# Patient Record
Sex: Female | Born: 1995 | Race: Black or African American | Hispanic: No | Marital: Single | Smoking: Former smoker
Health system: Southern US, Community
[De-identification: ages and names within clinical notes are randomized; demographics above are authoritative.]

## PROBLEM LIST (undated history)

## (undated) DIAGNOSIS — J45909 Unspecified asthma, uncomplicated: Secondary | ICD-10-CM

## (undated) DIAGNOSIS — L309 Dermatitis, unspecified: Secondary | ICD-10-CM

## (undated) DIAGNOSIS — Z91018 Allergy to other foods: Secondary | ICD-10-CM

## (undated) DIAGNOSIS — J309 Allergic rhinitis, unspecified: Secondary | ICD-10-CM

## (undated) HISTORY — DX: Unspecified asthma, uncomplicated: J45.909

## (undated) HISTORY — DX: Dermatitis, unspecified: L30.9

## (undated) HISTORY — DX: Allergic rhinitis, unspecified: J30.9

## (undated) HISTORY — PX: TYMPANOSTOMY TUBE PLACEMENT: SHX32

## (undated) HISTORY — DX: Allergy to other foods: Z91.018

---
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
## 2017-11-17 ENCOUNTER — Ambulatory Visit (INDEPENDENT_AMBULATORY_CARE_PROVIDER_SITE_OTHER): Payer: Medicaid Other | Admitting: Allergy and Immunology

## 2017-11-17 ENCOUNTER — Encounter: Payer: Self-pay | Admitting: Allergy and Immunology

## 2017-11-17 VITALS — BP 98/60 | Ht 68.4 in | Wt 160.6 lb

## 2017-11-17 DIAGNOSIS — T7800XD Anaphylactic reaction due to unspecified food, subsequent encounter: Secondary | ICD-10-CM

## 2017-11-17 DIAGNOSIS — L2089 Other atopic dermatitis: Secondary | ICD-10-CM

## 2017-11-17 DIAGNOSIS — J3089 Other allergic rhinitis: Secondary | ICD-10-CM

## 2017-11-17 DIAGNOSIS — J4520 Mild intermittent asthma, uncomplicated: Secondary | ICD-10-CM | POA: Diagnosis not present

## 2017-11-17 MED ORDER — ALBUTEROL SULFATE (2.5 MG/3ML) 0.083% IN NEBU: 2.5000 mg | INHALATION_SOLUTION | RESPIRATORY_TRACT | 1 refills | Status: AC | PRN

## 2017-11-17 MED ORDER — FLUTICASONE PROPIONATE 50 MCG/ACT NA SUSP: NASAL | 5 refills | Status: AC

## 2017-11-17 MED ORDER — LORATADINE 10 MG PO TABS: 10.0000 mg | ORAL_TABLET | Freq: Every day | ORAL | 5 refills | Status: AC | PRN

## 2017-11-17 MED ORDER — TRIAMCINOLONE ACETONIDE 0.1 % EX CREA: TOPICAL_CREAM | CUTANEOUS | 5 refills | Status: DC

## 2017-11-17 MED ORDER — ALBUTEROL SULFATE HFA 108 (90 BASE) MCG/ACT IN AERS: INHALATION_SPRAY | RESPIRATORY_TRACT | 1 refills | Status: DC

## 2017-11-17 MED ORDER — OLOPATADINE HCL 0.7 % OP SOLN: 1.0000 [drp] | OPHTHALMIC | 5 refills | Status: AC

## 2017-11-17 MED ORDER — CRISABOROLE 2 % EX OINT: 1.0000 "application " | TOPICAL_OINTMENT | Freq: Two times a day (BID) | CUTANEOUS | 5 refills | Status: DC | PRN

## 2017-11-17 MED ORDER — IPRATROPIUM BROMIDE 0.06 % NA SOLN: NASAL | 5 refills | Status: AC

## 2017-11-17 MED ORDER — EPINEPHRINE 0.3 MG/0.3ML IJ SOAJ: INTRAMUSCULAR | 1 refills | Status: DC

## 2017-11-17 NOTE — Assessment & Plan Note (Signed)
Currently well controlled.  Continue albuterol HFA, 1 to 2 inhalations every 6 hours if needed.  Subjective and objective measures of pulmonary function will be followed and the treatment plan will be adjusted accordingly.

## 2017-11-17 NOTE — Progress Notes (Signed)
Follow-up Note  RE: Madison NottinghamGenetria French MRN: [id number] DOB: Aug 06, 1995 Date of Office Visit: 11/17/2017  [facility] Referring provider: No ref. provider found  History of present illness: Madison French is a 22 y.o. female with asthma, allergic rhinitis, eczema, and food allergies presenting today for follow-up.  She was last seen in this clinic in August 2018.  She reports that in the interval since her last visit her asthma has been well controlled.  She does not recall having had to use her albuterol, though she does report experiencing dyspnea/chest tightness on occasion when she is in very hot environments such as her place of work.  She does not experience nocturnal awakenings due to lower respiratory symptoms.  Her nasal symptoms have been well controlled with cetirizine and/or fluticasone nasal spray as needed.  She has had no problems with her eczema this year.  She carefully avoids peanuts, tree nuts, and melons but needs a refill for epinephrine autoinjectors.  Assessment and plan: Mild intermittent asthma Currently well controlled.  Continue albuterol HFA, 1 to 2 inhalations every 6 hours if needed.  Subjective and objective measures of pulmonary function will be followed and the treatment plan will be adjusted accordingly.  Other allergic rhinitis  Continue appropriate allergen avoidance measures, ipratropium nasal spray if needed, and cetirizine if needed.  Nasal saline spray (i.e., Simply Saline) or nasal saline lavage (i.e., NeilMed) is recommended as needed and prior to medicated nasal sprays.  If allergen avoidance measures and medications fail to adequately relieve symptoms, aeroallergen immunotherapy will be considered.  Flexural atopic dermatitis  Currently well controlled.  A prescription has been provided for Eucrisa (crisaborole) 2% ointment twice a day to affected areas as needed.  Anaphylactic shock due to  adverse food reaction  Continue meticulous avoidance of peanuts, tree nuts, sesame seed, watermelon, and cantaloupe and have access to epinephrine autoinjector 2 pack in case of accidental ingestion.  Food allergy action plan is in place.  A refill prescription has been provided for epinephrine 0.3 mg autoinjector 2 pack along with instructions for its proper administration.   Meds ordered this encounter  Medications  . albuterol (PROVENTIL HFA) 108 (90 Base) MCG/ACT inhaler    Sig: 2 puffs every 4 hours if needed for wheezing or coughing spells    Dispense:  18 g    Refill:  1  . fluticasone (FLONASE) 50 MCG/ACT nasal spray    Sig: 2 sprays per nostril once a day for stuffy nose    Dispense:  16 g    Refill:  5  . triamcinolone cream (KENALOG) 0.1 %    Sig: Apply twice a day to red itchy areas below the face.    Dispense:  80 g    Refill:  5  . EPINEPHrine (EPIPEN 2-PAK) 0.3 mg/0.3 mL IJ SOAJ injection    Sig: Use as directed for severe allergic reaction.    Dispense:  2 Device    Refill:  1  . loratadine (CLARITIN) 10 MG tablet    Sig: Take 1 tablet (10 mg total) by mouth daily as needed for allergies, rhinitis or itching.    Dispense:  30 tablet    Refill:  5  . albuterol (PROVENTIL) (2.5 MG/3ML) 0.083% nebulizer solution    Sig: Take 3 mLs (2.5 mg total) by nebulization every 4 (four) hours as needed for wheezing or shortness of breath.    Dispense:  75 mL    Refill:  1  . ipratropium (ATROVENT) 0.06 % nasal spray    Sig: Two sprays each nostril three times a day as needed for runny nose.    Dispense:  15 mL    Refill:  5  . Crisaborole (EUCRISA) 2 % OINT    Sig: Apply 1 application topically 2 (two) times daily as needed (to red itchy areas).    Dispense:  100 g    Refill:  5  . Olopatadine HCl (PAZEO) 0.7 % SOLN    Sig: Place 1 drop into both eyes 1 day or 1 dose.    Dispense:  1 Bottle    Refill:  5    Diagnostics: Spirometry:  Normal with an FEV1 of 100%  predicted.  Please see scanned spirometry results for details.    Physical examination: Blood pressure 98/60, height 5' 8.4" (1.737 m), weight 160 lb 9.6 oz (72.8 kg).  General: Alert, interactive, in no acute distress. HEENT: TMs pearly gray, turbinates mildly edematous without discharge, post-pharynx mildly erythematous. Neck: Supple without lymphadenopathy. Lungs: Clear to auscultation without wheezing, rhonchi or rales. CV: Normal S1, S2 without murmurs. Skin: Warm and dry, without lesions or rashes.  The following portions of the patient's history were reviewed and updated as appropriate: allergies, current medications, past family history, past medical history, past social history, past surgical history and problem list.  Allergies as of 11/17/2017      Reactions   Cantaloupe Extract Allergy Skin Test Anaphylaxis   Sesame Oil Anaphylaxis   Citrullus Vulgaris Other (See Comments)   Unknown  Unknown    Peanut-containing Drug Products    All tree nuts   Watermelon Flavor    Other Rash   All tree nuts      Medication List        Accurate as of 11/17/17 12:24 PM. Always use your most recent med list.          albuterol 108 (90 Base) MCG/ACT inhaler Commonly known as:  PROVENTIL HFA;VENTOLIN HFA 2 puffs every 4 hours if needed for wheezing or coughing spells   albuterol (2.5 MG/3ML) 0.083% nebulizer solution Commonly known as:  PROVENTIL Take 3 mLs (2.5 mg total) by nebulization every 4 (four) hours as needed for wheezing or shortness of breath.   Crisaborole 2 % Oint Apply 1 application topically 2 (two) times daily as needed (to red itchy areas).   EPINEPHrine 0.3 mg/0.3 mL Soaj injection Commonly known as:  EPI-PEN Use as directed for severe allergic reaction.   fluticasone 50 MCG/ACT nasal spray Commonly known as:  FLONASE 2 sprays per nostril once a day for stuffy nose   ipratropium 0.06 % nasal spray Commonly known as:  ATROVENT Two sprays each nostril  three times a day as needed for runny nose.   loratadine 10 MG tablet Commonly known as:  CLARITIN Take 1 tablet (10 mg total) by mouth daily as needed for allergies, rhinitis or itching.   Olopatadine HCl 0.7 % Soln Place 1 drop into both eyes 1 day or 1 dose.   pimecrolimus 1 % cream Commonly known as:  ELIDEL Apply topically 2 (two) times daily.   triamcinolone cream 0.1 % Commonly known as:  KENALOG Apply twice a day to red itchy areas below the face.       Allergies  Allergen Reactions  . Cantaloupe Extract Allergy Skin Test Anaphylaxis  . Sesame Oil Anaphylaxis  . Citrullus Vulgaris Other (See Comments)    Unknown  Unknown   .  Peanut-Containing Drug Products     All tree nuts  . Watermelon Flavor   . Other Rash    All tree nuts   Review of systems: Review of systems negative except as noted in HPI / PMHx or noted below: Constitutional: Negative.  HENT: Negative.   Eyes: Negative.  Respiratory: Negative.   Cardiovascular: Negative.  Gastrointestinal: Negative.  Genitourinary: Negative.  Musculoskeletal: Negative.  Neurological: Negative.  Endo/Heme/Allergies: Negative.  Cutaneous: Negative.  Past Medical History:  Diagnosis Date  . Allergic rhinitis   . Asthma   . Eczema   . Food allergy     Family History  Problem Relation Age of Onset  . Allergic rhinitis Mother   . Asthma Mother   . Multiple sclerosis Mother   . Angioedema Neg Hx   . Eczema Neg Hx   . Immunodeficiency Neg Hx   . Urticaria Neg Hx     Social History   Socioeconomic History  . Marital status: Single    Spouse name: Not on file  . Number of children: Not on file  . Years of education: Not on file  . Highest education level: Not on file  Occupational History  . Not on file  Social Needs  . Financial resource strain: Not on file  . Food insecurity:    Worry: Not on file    Inability: Not on file  . Transportation needs:    Medical: Not on file    Non-medical: Not on  file  Tobacco Use  . Smoking status: Current Every Day Smoker    Packs/day: 0.25    Years: 3.00    Pack years: 0.75    Types: Cigarettes  . Smokeless tobacco: Never Used  Substance and Sexual Activity  . Alcohol use: No    Alcohol/week: 0.0 standard drinks  . Drug use: No  . Sexual activity: Yes  Lifestyle  . Physical activity:    Days per week: Not on file    Minutes per session: Not on file  . Stress: Not on file  Relationships  . Social connections:    Talks on phone: Not on file    Gets together: Not on file    Attends religious service: Not on file    Active member of club or organization: Not on file    Attends meetings of clubs or organizations: Not on file    Relationship status: Not on file  . Intimate partner violence:    Fear of current or ex partner: Not on file    Emotionally abused: Not on file    Physically abused: Not on file    Forced sexual activity: Not on file  Other Topics Concern  . Not on file  Social History Narrative  . Not on file    I appreciate the opportunity to take part in Madison French's French. Please do not hesitate to contact me with questions.  Sincerely,   R. Jorene Guest, MD

## 2017-11-17 NOTE — Assessment & Plan Note (Signed)
   Continue meticulous avoidance of peanuts, tree nuts, sesame seed, watermelon, and cantaloupe and have access to epinephrine autoinjector 2 pack in case of accidental ingestion.  Food allergy action plan is in place.  A refill prescription has been provided for epinephrine 0.3 mg autoinjector 2 pack along with instructions for its proper administration. 

## 2017-11-17 NOTE — Assessment & Plan Note (Signed)
   Currently well controlled.  A prescription has been provided for Eucrisa (crisaborole) 2% ointment twice a day to affected areas as needed.

## 2017-11-17 NOTE — Patient Instructions (Addendum)
Mild intermittent asthma Currently well controlled.  Continue albuterol HFA, 1 to 2 inhalations every 6 hours if needed.  Subjective and objective measures of pulmonary function will be followed and the treatment plan will be adjusted accordingly.  Other allergic rhinitis  Continue appropriate allergen avoidance measures, ipratropium nasal spray if needed, and cetirizine if needed.  Nasal saline spray (i.e., Simply Saline) or nasal saline lavage (i.e., NeilMed) is recommended as needed and prior to medicated nasal sprays.  If allergen avoidance measures and medications fail to adequately relieve symptoms, aeroallergen immunotherapy will be considered.  Flexural atopic dermatitis  Currently well controlled.  A prescription has been provided for Eucrisa (crisaborole) 2% ointment twice a day to affected areas as needed.  Anaphylactic shock due to adverse food reaction  Continue meticulous avoidance of peanuts, tree nuts, sesame seed, watermelon, and cantaloupe and have access to epinephrine autoinjector 2 pack in case of accidental ingestion.  Food allergy action plan is in place.  A refill prescription has been provided for epinephrine 0.3 mg autoinjector 2 pack along with instructions for its proper administration.   Return in about 1 year (around 11/18/2018), or if symptoms worsen or fail to improve.

## 2017-11-17 NOTE — Assessment & Plan Note (Signed)
   Continue appropriate allergen avoidance measures, ipratropium nasal spray if needed, and cetirizine if needed.  Nasal saline spray (i.e., Simply Saline) or nasal saline lavage (i.e., NeilMed) is recommended as needed and prior to medicated nasal sprays.  If allergen avoidance measures and medications fail to adequately relieve symptoms, aeroallergen immunotherapy will be considered.

## 2018-05-17 IMAGING — CT CT ABD-PELV W/ CM
2 of 4 series · 16 of 46 positions shown, 18 images · IV contrast (APPLIED)
Comparison: None.

CLINICAL DATA: Constant generalized abdominal pain and vomiting
beginning this morning.

EXAM:
CT ABDOMEN AND PELVIS WITH CONTRAST
TECHNIQUE: Multidetector CT imaging of the abdomen and pelvis was performed
using the standard protocol following bolus administration of
intravenous contrast.
CONTRAST:  100mL [id number] IOPAMIDOL ([id number]) INJECTION 61%

[Series 2: axial st · axial · 0.75mm/px · z∈[-617,-197]mm · 13 of 92 slices shown, 15 images]
[im 4/92  soft-tissue]
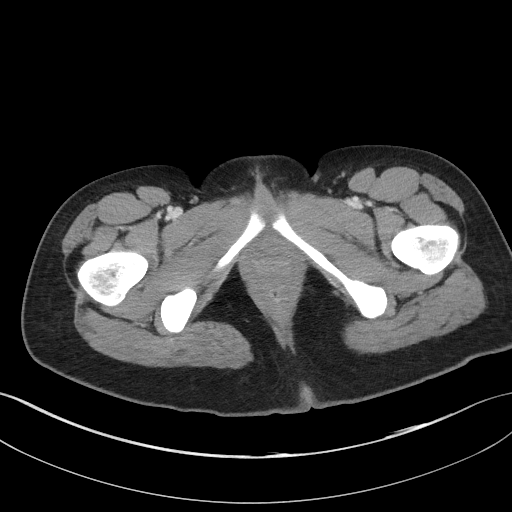
[im 4/92  bone]
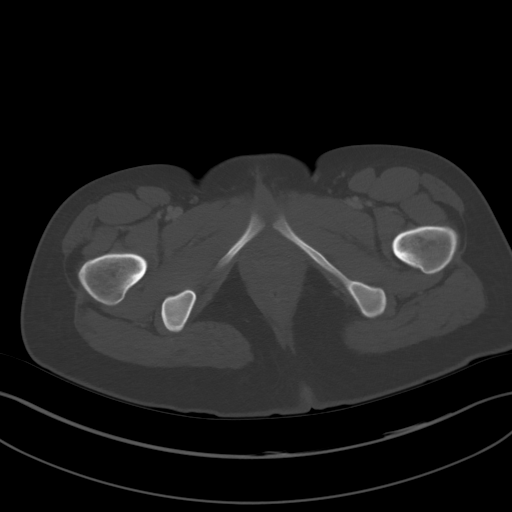
[im 12/92  soft-tissue]
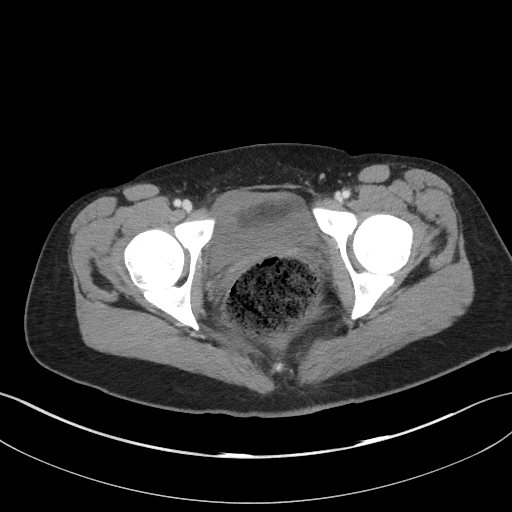
[im 19/92  soft-tissue]
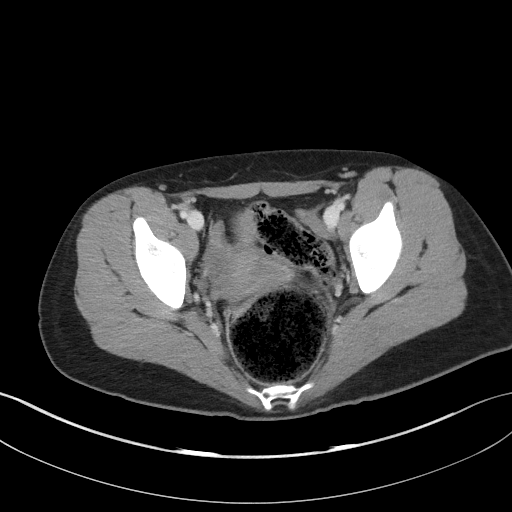
[im 27/92  soft-tissue]
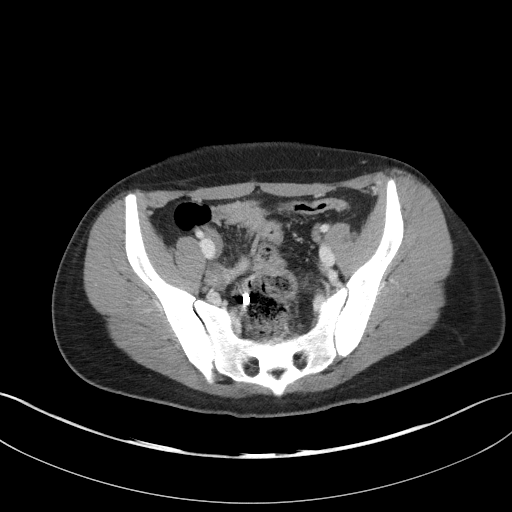
[im 31/92  soft-tissue]
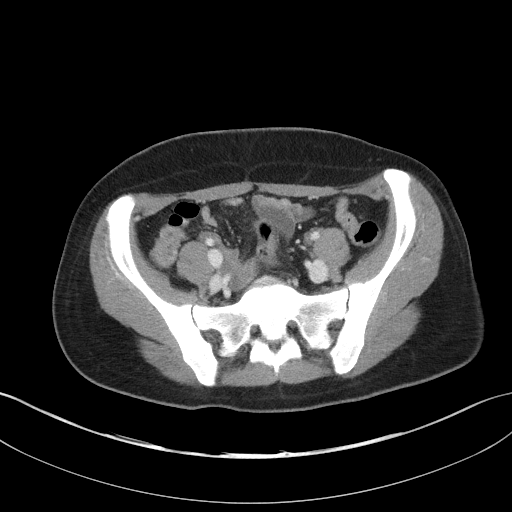
[im 38/92  soft-tissue]
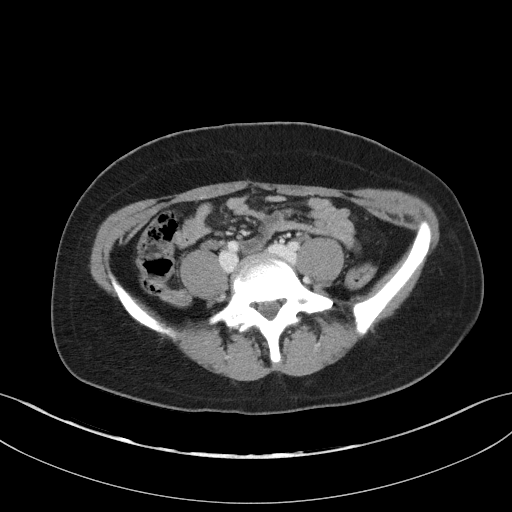
[im 46/92  soft-tissue]
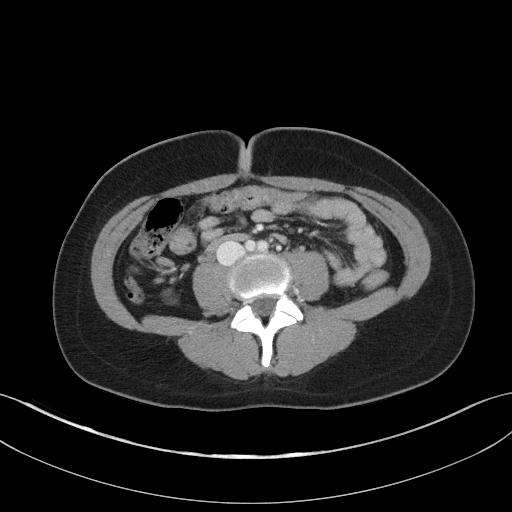
[im 54/92  soft-tissue]
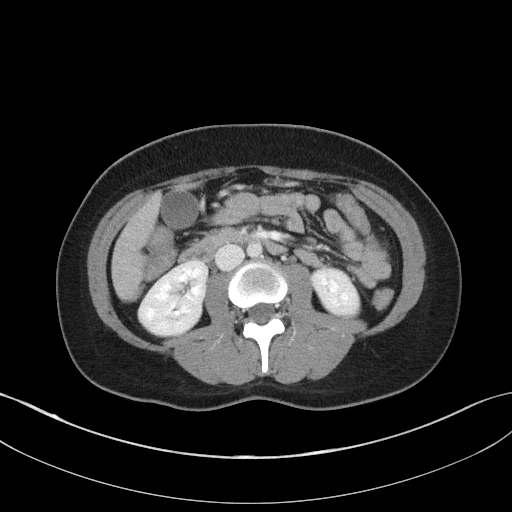
[im 61/92  soft-tissue]
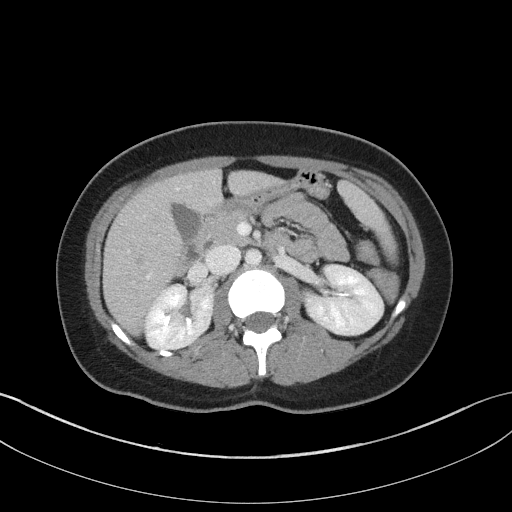
[im 61/92  bone]
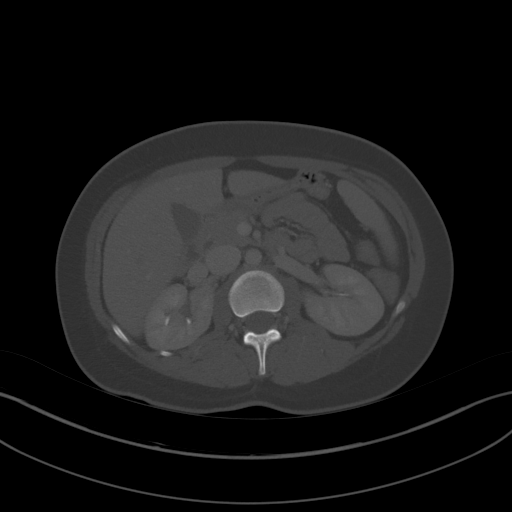
[im 65/92  soft-tissue]
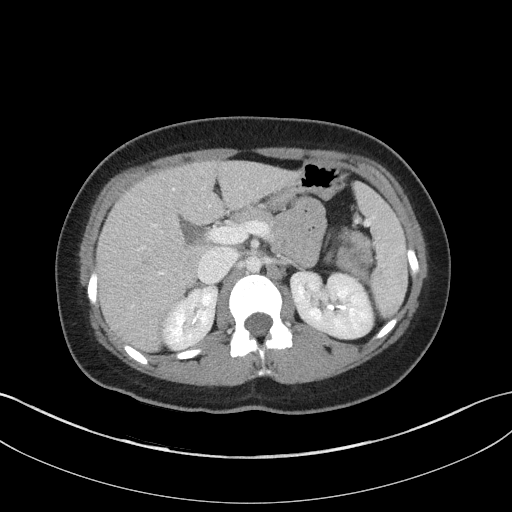
[im 73/92  soft-tissue]
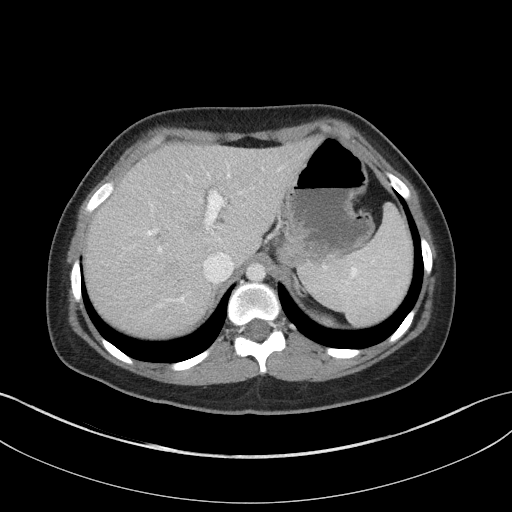
[im 80/92  soft-tissue]
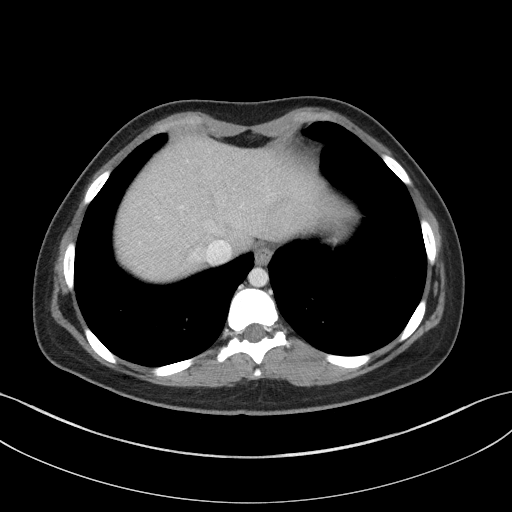
[im 88/92  soft-tissue]
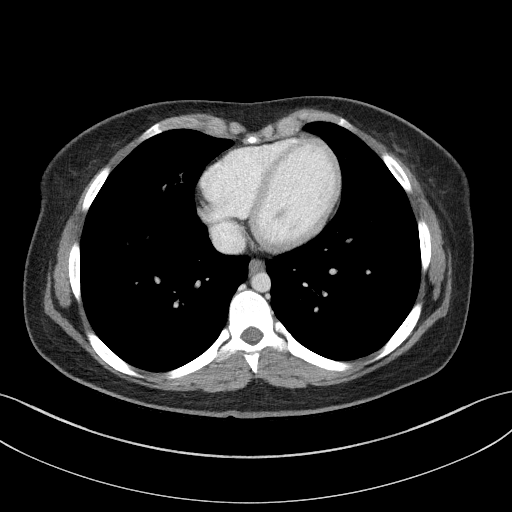

[Series 5: coronal st · coronal · 0.68mm/px · 3 of 79 slices shown]
[im 27/79  soft-tissue]
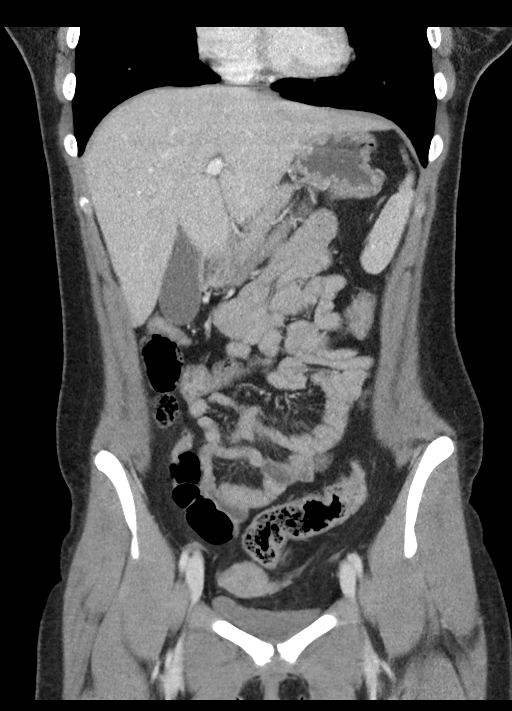
[im 35/79  soft-tissue]
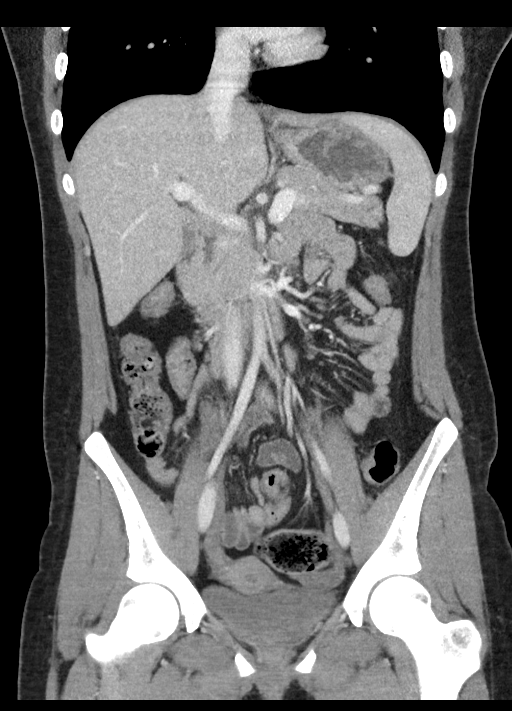
[im 44/79  soft-tissue]
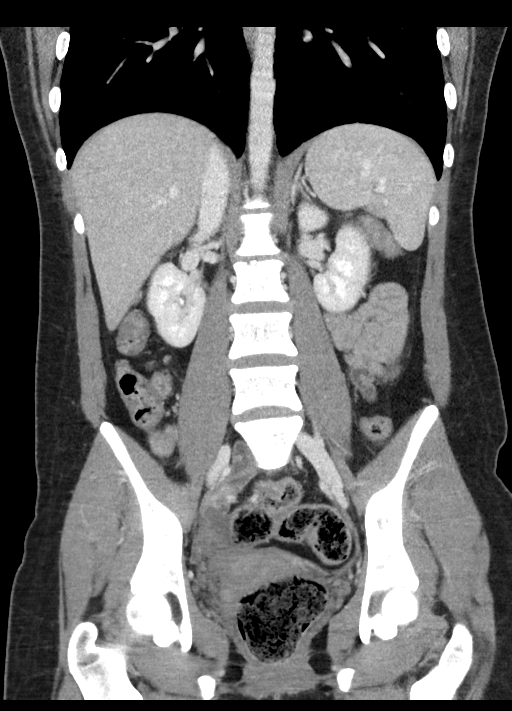

[16 of 46 positions shown; findings below may reference images not displayed]

FINDINGS: Lower chest: Mild atelectasis in the medial segment right middle
lobe.

Hepatobiliary: No focal liver abnormality.No evidence of biliary
obstruction or stone.

Pancreas: Unremarkable.

Spleen: Unremarkable.

Adrenals/Urinary Tract: Negative adrenals. No hydronephrosis or
stone. Unremarkable bladder.

Stomach/Bowel: No obstruction or inflammation. The rectum is
distended by stool to 9 cm, without superimposed stercoral colitis.
There is a rounded metallic density also seen at the level the
rectum, likely luminal, but near the non thickened wall. No
appendicitis.

Vascular/Lymphatic: No vascular abnormality.  No mass or adenopathy.

Reproductive:No pathologic findings.

Other: No ascites or pneumoperitoneum.

Musculoskeletal: No acute or aggressive finding. L2 anterior
superior corner limbus vertebra.
IMPRESSION: 1. No obstruction or inflammation.  No appendicitis.
2. Rectal distention by stool.
3. Small BB shaped object seen at in the rectum.

## 2018-11-23 ENCOUNTER — Other Ambulatory Visit: Payer: Self-pay

## 2018-11-23 ENCOUNTER — Encounter: Payer: Self-pay | Admitting: Allergy and Immunology

## 2018-11-23 ENCOUNTER — Ambulatory Visit (INDEPENDENT_AMBULATORY_CARE_PROVIDER_SITE_OTHER): Payer: Medicaid Other | Admitting: Allergy and Immunology

## 2018-11-23 ENCOUNTER — Telehealth: Payer: Self-pay | Admitting: *Deleted

## 2018-11-23 VITALS — BP 84/50 | HR 70 | Temp 96.3°F | Resp 16 | Ht 68.1 in | Wt 139.6 lb

## 2018-11-23 DIAGNOSIS — I950 Idiopathic hypotension: Secondary | ICD-10-CM

## 2018-11-23 DIAGNOSIS — J301 Allergic rhinitis due to pollen: Secondary | ICD-10-CM | POA: Diagnosis not present

## 2018-11-23 DIAGNOSIS — L2089 Other atopic dermatitis: Secondary | ICD-10-CM

## 2018-11-23 DIAGNOSIS — J4520 Mild intermittent asthma, uncomplicated: Secondary | ICD-10-CM

## 2018-11-23 DIAGNOSIS — I959 Hypotension, unspecified: Secondary | ICD-10-CM | POA: Insufficient documentation

## 2018-11-23 DIAGNOSIS — T7800XD Anaphylactic reaction due to unspecified food, subsequent encounter: Secondary | ICD-10-CM

## 2018-11-23 DIAGNOSIS — Z720 Tobacco use: Secondary | ICD-10-CM

## 2018-11-23 DIAGNOSIS — Z87891 Personal history of nicotine dependence: Secondary | ICD-10-CM | POA: Insufficient documentation

## 2018-11-23 MED ORDER — ALBUTEROL SULFATE HFA 108 (90 BASE) MCG/ACT IN AERS: INHALATION_SPRAY | RESPIRATORY_TRACT | 1 refills | Status: DC

## 2018-11-23 MED ORDER — EUCRISA 2 % EX OINT: 1.0000 "application " | TOPICAL_OINTMENT | Freq: Two times a day (BID) | CUTANEOUS | 5 refills | Status: AC | PRN

## 2018-11-23 MED ORDER — EPINEPHRINE 0.3 MG/0.3ML IJ SOAJ: INTRAMUSCULAR | 1 refills | Status: DC

## 2018-11-23 NOTE — Telephone Encounter (Signed)
PA approved for Eucrisa. Faxed to pharmacy.  

## 2018-11-23 NOTE — Assessment & Plan Note (Signed)
   The patient has been made aware of the low blood pressure readings and has been encouraged to follow up with her primary care physician in the near future regarding this issue.  Madison French has verbalized understanding and agreed to do so.

## 2018-11-23 NOTE — Progress Notes (Signed)
Follow-up Note  RE: [name] MRN: [id number] DOB: 04-14-1995 Date of Office Visit: 11/23/2018  Primary care provider: [place] Referring provider: [place]*  History of present illness: Madison French is a 23 y.o. female with asthma, allergic rhinitis, eczema, and food allergies presenting today for follow-up.  She was last seen in this clinic in August 2019.  She reports that in the interval since her previous visit she has rarely required albuterol and does not experience limitations of normal daily activities or nocturnal awakenings due to lower respiratory symptoms.  Her allergic rhinitis symptoms tend to be triggered by pollen exposure, however she reports that the use of ipratropium nasal spray provides adequate relief.  Her eczema is also triggered with pollen exposure.  On her last visit she was prescribed Eucrisa, however she reports that she never received to the [place] from the pharmacy.  She has been attempting to control the eczema with over-the-counter moisturizers.  In the interval since her previous visit she has successfully avoided peanuts, tree nuts, sesame seed, watermelon, and cantaloupe without accidental ingestions.  She needs a refill for medications, including epinephrine autoinjector.  Based upon low blood pressure readings, she was questioned about symptoms and reports that she experiences occasional dizziness when transitioning from sitting to standing.  She has no history of hypotension or hypertension.  Assessment and plan: Mild intermittent asthma Well controlled.  Continue albuterol HFA, 1 to 2 inhalations every 6 hours if needed and 15 minutes prior to vigorous exercise.  Subjective and objective measures of pulmonary function will be followed and the treatment plan will be adjusted accordingly.  Seasonal allergic rhinitis due to pollen Stable.  Continue appropriate allergen avoidance measures, ipratropium  nasal spray if needed, and cetirizine if needed.  Nasal saline spray (i.e., Simply Saline) or nasal saline lavage (i.e., NeilMed) is recommended as needed and prior to medicated nasal sprays.  If allergen avoidance measures and medications fail to adequately relieve symptoms, aeroallergen immunotherapy will be considered.  Flexural atopic dermatitis  Continue appropriate skin care measures.    A prescription has been provided for Eucrisa (crisaborole) 2% ointment twice a day to affected areas as needed.  Hypotension  The patient has been made aware of the low blood pressure readings and has been encouraged to follow up with her primary care physician in the near future regarding this issue.  Whitney has verbalized understanding and agreed to do so.  Food allergy  Continue meticulous avoidance of peanuts, tree nuts, sesame seed, watermelon, and cantaloupe and have access to epinephrine autoinjector 2 pack in case of accidental ingestion.  Food allergy action plan is in place.  A refill prescription has been provided for epinephrine 0.3 mg autoinjector 2 pack along with instructions for its proper administration.  Tobacco use  Tobacco cessation has been discussed and encouraged.   Meds ordered this encounter  Medications  . Crisaborole (EUCRISA) 2 % OINT    Sig: Apply 1 application topically 2 (two) times daily as needed (to red itchy areas).    Dispense:  100 g    Refill:  5  . EPINEPHrine (EPIPEN 2-PAK) 0.3 mg/0.3 mL IJ SOAJ injection    Sig: Use as directed for severe allergic reaction.    Dispense:  2 each    Refill:  1  . albuterol (PROVENTIL HFA) 108 (90 Base) MCG/ACT inhaler    Sig: 2 puffs every 4 hours if needed for wheezing or coughing spells    Dispense:  18 g    Refill:  1    Diagnostics: Spirometry reveals an FVC of 3.98 L and an FEV1 of 3.02 L (92% predicted).  See spirometry    Physical examination: Blood pressure (!) 84/50, pulse 70, temperature (!)  96.3 F (35.7 C), temperature source Temporal, resp. rate 16, height 5' 8.1" (1.73 m), weight 139 lb 9.6 oz (63.3 kg).  General: Alert, interactive, in no acute distress. HEENT: TMs pearly gray, turbinates mildly edematous without discharge, post-pharynx unremarkable. Neck: Supple without lymphadenopathy. Lungs: Clear to auscultation without wheezing, rhonchi or rales. CV: Normal S1, S2 without murmurs. Skin: Warm and dry, without lesions or rashes.  The following portions of the patient's history were reviewed and updated as appropriate: allergies, current medications, past family history, past medical history, past social history, past surgical history and problem list.  Allergies as of 11/23/2018      Reactions   Cantaloupe Extract Allergy Skin Test Anaphylaxis   Sesame Oil Anaphylaxis   Citrullus Vulgaris Other (See Comments)   Unknown  Unknown    Peanut-containing Drug Products    All tree nuts   Watermelon Flavor    Other Rash   All tree nuts      Medication List       Accurate as of November 23, 2018 11:45 AM. If you have any questions, ask your nurse or doctor.        albuterol (2.5 MG/3ML) 0.083% nebulizer solution Commonly known as: PROVENTIL Take 3 mLs (2.5 mg total) by nebulization every 4 (four) hours as needed for wheezing or shortness of breath.   albuterol 108 (90 Base) MCG/ACT inhaler Commonly known as: Proventil HFA 2 puffs every 4 hours if needed for wheezing or coughing spells   EPINEPHrine 0.3 mg/0.3 mL Soaj injection Commonly known as: EpiPen 2-Pak Use as directed for severe allergic reaction.   Eucrisa 2 % Oint Generic drug: Crisaborole Apply 1 application topically 2 (two) times daily as needed (to red itchy areas).   fluticasone 50 MCG/ACT nasal spray Commonly known as: FLONASE 2 sprays per nostril once a day for stuffy nose   ipratropium 0.06 % nasal spray Commonly known as: ATROVENT Two sprays each nostril three times a day as needed for  runny nose.   loratadine 10 MG tablet Commonly known as: CLARITIN Take 1 tablet (10 mg total) by mouth daily as needed for allergies, rhinitis or itching.   Olopatadine HCl 0.7 % Soln Commonly known as: Pazeo Place 1 drop into both eyes 1 day or 1 dose.   pimecrolimus 1 % cream Commonly known as: Elidel Apply topically 2 (two) times daily.   triamcinolone cream 0.1 % Commonly known as: KENALOG Apply twice a day to red itchy areas below the face.       Allergies  Allergen Reactions  . Cantaloupe Extract Allergy Skin Test Anaphylaxis  . Sesame Oil Anaphylaxis  . Citrullus Vulgaris Other (See Comments)    Unknown  Unknown   . Peanut-Containing Drug Products     All tree nuts  . Watermelon Flavor   . Other Rash    All tree nuts   Review of systems: Review of systems negative except as noted in HPI / PMHx or noted below: Constitutional: Negative.  HENT: Negative.   Eyes: Negative.  Respiratory: Negative.   Cardiovascular: Negative.  Gastrointestinal: Negative.  Genitourinary: Negative.  Musculoskeletal: Negative.  Neurological: Negative.  Endo/Heme/Allergies: Negative.  Cutaneous: Negative.  Past Medical History:  Diagnosis Date  . Allergic  rhinitis   . Asthma   . Eczema   . Food allergy     Family History  Problem Relation Age of Onset  . Allergic rhinitis Mother   . Asthma Mother   . Multiple sclerosis Mother   . Angioedema Neg Hx   . Eczema Neg Hx   . Immunodeficiency Neg Hx   . Urticaria Neg Hx     Social History   Socioeconomic History  . Marital status: Single    Spouse name: Not on file  . Number of children: Not on file  . Years of education: Not on file  . Highest education level: Not on file  Occupational History  . Not on file  Social Needs  . Financial resource strain: Not on file  . Food insecurity    Worry: Not on file    Inability: Not on file  . Transportation needs    Medical: Not on file    Non-medical: Not on file   Tobacco Use  . Smoking status: Current Every Day Smoker    Packs/day: 0.25    Years: 3.00    Pack years: 0.75    Types: Cigarettes  . Smokeless tobacco: Never Used  Substance and Sexual Activity  . Alcohol use: No    Alcohol/week: 0.0 standard drinks  . Drug use: No  . Sexual activity: Yes  Lifestyle  . Physical activity    Days per week: Not on file    Minutes per session: Not on file  . Stress: Not on file  Relationships  . Social Musicianconnections    Talks on phone: Not on file    Gets together: Not on file    Attends religious service: Not on file    Active member of club or organization: Not on file    Attends meetings of clubs or organizations: Not on file    Relationship status: Not on file  . Intimate partner violence    Fear of current or ex partner: Not on file    Emotionally abused: Not on file    Physically abused: Not on file    Forced sexual activity: Not on file  Other Topics Concern  . Not on file  Social History Narrative  . Not on file    I appreciate the opportunity to take part in Doralee's care. Please do not hesitate to contact me with questions.  Sincerely,   R. Jorene Guestarter Shirrell Solinger, MD

## 2018-11-23 NOTE — Patient Instructions (Addendum)
Mild intermittent asthma Well controlled.  Continue albuterol HFA, 1 to 2 inhalations every 6 hours if needed and 15 minutes prior to vigorous exercise.  Subjective and objective measures of pulmonary function will be followed and the treatment plan will be adjusted accordingly.  Seasonal allergic rhinitis due to pollen Stable.  Continue appropriate allergen avoidance measures, ipratropium nasal spray if needed, and cetirizine if needed.  Nasal saline spray (i.e., Simply Saline) or nasal saline lavage (i.e., NeilMed) is recommended as needed and prior to medicated nasal sprays.  If allergen avoidance measures and medications fail to adequately relieve symptoms, aeroallergen immunotherapy will be considered.  Flexural atopic dermatitis  Continue appropriate skin care measures.    A prescription has been provided for Eucrisa (crisaborole) 2% ointment twice a day to affected areas as needed.  Hypotension  The patient has been made aware of the low blood pressure readings and has been encouraged to follow up with her primary care physician in the near future regarding this issue.  Madison French has verbalized understanding and agreed to do so.  Food allergy  Continue meticulous avoidance of peanuts, tree nuts, sesame seed, watermelon, and cantaloupe and have access to epinephrine autoinjector 2 pack in case of accidental ingestion.  Food allergy action plan is in place.  A refill prescription has been provided for epinephrine 0.3 mg autoinjector 2 pack along with instructions for its proper administration.  Tobacco use  Tobacco cessation has been discussed and encouraged.   Return in about 1 year (around 11/23/2019), or if symptoms worsen or fail to improve.

## 2018-11-23 NOTE — Assessment & Plan Note (Signed)
   Tobacco cessation has been discussed and encouraged. 

## 2018-11-23 NOTE — Assessment & Plan Note (Signed)
   Continue appropriate skin care measures.  A prescription has been provided for Eucrisa (crisaborole) 2% ointment twice a day to affected areas as needed. 

## 2018-11-23 NOTE — Assessment & Plan Note (Addendum)
Stable.  Continue appropriate allergen avoidance measures, ipratropium nasal spray if needed, and cetirizine if needed.  Nasal saline spray (i.e., Simply Saline) or nasal saline lavage (i.e., NeilMed) is recommended as needed and prior to medicated nasal sprays.  If allergen avoidance measures and medications fail to adequately relieve symptoms, aeroallergen immunotherapy will be considered.

## 2018-11-23 NOTE — Assessment & Plan Note (Signed)
   Continue meticulous avoidance of peanuts, tree nuts, sesame seed, watermelon, and cantaloupe and have access to epinephrine autoinjector 2 pack in case of accidental ingestion.  Food allergy action plan is in place.  A refill prescription has been provided for epinephrine 0.3 mg autoinjector 2 pack along with instructions for its proper administration. 

## 2018-11-23 NOTE — Assessment & Plan Note (Signed)
Well controlled.  Continue albuterol HFA, 1 to 2 inhalations every 6 hours if needed and 15 minutes prior to vigorous exercise.  Subjective and objective measures of pulmonary function will be followed and the treatment plan will be adjusted accordingly.

## 2019-09-16 ENCOUNTER — Other Ambulatory Visit: Payer: Self-pay

## 2019-09-16 ENCOUNTER — Inpatient Hospital Stay (HOSPITAL_COMMUNITY): Payer: Medicaid Other

## 2019-09-16 ENCOUNTER — Inpatient Hospital Stay (EMERGENCY_DEPARTMENT_HOSPITAL)
Admission: AD | Admit: 2019-09-16 | Discharge: 2019-09-17 | Disposition: A | Discharge status: Home / Self Care | Payer: Medicaid Other | Source: Home / Self Care | Admitting: Obstetrics and Gynecology

## 2019-09-16 ENCOUNTER — Encounter (HOSPITAL_BASED_OUTPATIENT_CLINIC_OR_DEPARTMENT_OTHER): Payer: Self-pay | Admitting: Emergency Medicine

## 2019-09-16 DIAGNOSIS — O219 Vomiting of pregnancy, unspecified: Secondary | ICD-10-CM | POA: Diagnosis present

## 2019-09-16 DIAGNOSIS — O26891 Other specified pregnancy related conditions, first trimester: Secondary | ICD-10-CM

## 2019-09-16 DIAGNOSIS — R102 Pelvic and perineal pain: Secondary | ICD-10-CM | POA: Insufficient documentation

## 2019-09-16 DIAGNOSIS — O99321 Drug use complicating pregnancy, first trimester: Secondary | ICD-10-CM | POA: Diagnosis not present

## 2019-09-16 DIAGNOSIS — F1290 Cannabis use, unspecified, uncomplicated: Secondary | ICD-10-CM | POA: Diagnosis not present

## 2019-09-16 DIAGNOSIS — Z79899 Other long term (current) drug therapy: Secondary | ICD-10-CM | POA: Diagnosis not present

## 2019-09-16 DIAGNOSIS — R109 Unspecified abdominal pain: Secondary | ICD-10-CM

## 2019-09-16 DIAGNOSIS — F17210 Nicotine dependence, cigarettes, uncomplicated: Secondary | ICD-10-CM | POA: Insufficient documentation

## 2019-09-16 DIAGNOSIS — Z3A01 Less than 8 weeks gestation of pregnancy: Secondary | ICD-10-CM | POA: Insufficient documentation

## 2019-09-16 DIAGNOSIS — Z7951 Long term (current) use of inhaled steroids: Secondary | ICD-10-CM | POA: Diagnosis not present

## 2019-09-16 DIAGNOSIS — J45909 Unspecified asthma, uncomplicated: Secondary | ICD-10-CM | POA: Insufficient documentation

## 2019-09-16 DIAGNOSIS — O99511 Diseases of the respiratory system complicating pregnancy, first trimester: Secondary | ICD-10-CM | POA: Diagnosis not present

## 2019-09-16 DIAGNOSIS — O99331 Smoking (tobacco) complicating pregnancy, first trimester: Secondary | ICD-10-CM | POA: Insufficient documentation

## 2019-09-16 DIAGNOSIS — R1084 Generalized abdominal pain: Secondary | ICD-10-CM | POA: Diagnosis not present

## 2019-09-16 DIAGNOSIS — Z91010 Allergy to peanuts: Secondary | ICD-10-CM | POA: Diagnosis not present

## 2019-09-16 LAB — COMPREHENSIVE METABOLIC PANEL
ALT: 14 U/L (ref 0–44)
AST: 21 U/L (ref 15–41)
Albumin: 5 g/dL (ref 3.5–5.0)
Alkaline Phosphatase: 44 U/L (ref 38–126)
Anion gap: 15 (ref 5–15)
BUN: 7 mg/dL (ref 6–20)
CO2: 17 mmol/L — ABNORMAL LOW (ref 22–32)
Calcium: 9.9 mg/dL (ref 8.9–10.3)
Chloride: 105 mmol/L (ref 98–111)
Creatinine, Ser: 0.68 mg/dL (ref 0.44–1.00)
GFR calc Af Amer: >60 mL/min (ref >60)
GFR calc non Af Amer: >60 mL/min (ref >60)
Glucose, Bld: 143 mg/dL — ABNORMAL HIGH (ref 70–99)
Potassium: 3.3 mmol/L — ABNORMAL LOW (ref 3.5–5.1)
Sodium: 137 mmol/L (ref 135–145)
Total Bilirubin: 1 mg/dL (ref 0.3–1.2)
Total Protein: 8.5 g/dL — ABNORMAL HIGH (ref 6.5–8.1)

## 2019-09-16 LAB — CBC
HCT: 40.6 % (ref 36.0–46.0)
Hemoglobin: 14 g/dL (ref 12.0–15.0)
MCH: 33 pg (ref 26.0–34.0)
MCHC: 34.5 g/dL (ref 30.0–36.0)
MCV: 95.8 fL (ref 80.0–100.0)
Platelets: 399 10*3/uL (ref 150–400)
RBC: 4.24 MIL/uL (ref 3.87–5.11)
RDW: 12 % (ref 11.5–15.5)
WBC: 12.3 10*3/uL — ABNORMAL HIGH (ref 4.0–10.5)
nRBC: 0 % (ref 0.0–0.2)

## 2019-09-16 LAB — LIPASE, BLOOD: Lipase: 34 U/L (ref 11–51)

## 2019-09-16 LAB — HCG, QUANTITATIVE, PREGNANCY: hCG, Beta Chain, Quant, S: 26071 m[IU]/mL — ABNORMAL HIGH (ref <5)

## 2019-09-16 MED ORDER — ONDANSETRON HCL 4 MG/2ML IJ SOLN
4.0000 mg | Freq: Once | INTRAMUSCULAR | Status: AC | PRN
  Administered 2019-09-16: 4 mg via INTRAVENOUS

## 2019-09-16 MED ORDER — FAMOTIDINE 20 MG PO TABS: 20.0000 mg | ORAL_TABLET | Freq: Two times a day (BID) | ORAL | 2 refills | Status: AC

## 2019-09-16 MED ORDER — ONDANSETRON HCL 4 MG/2ML IJ SOLN
INTRAMUSCULAR | Status: AC
  Filled 2019-09-16: qty 2

## 2019-09-16 MED ORDER — M.V.I. ADULT IV INJ
Freq: Once | INTRAVENOUS | Status: AC
  Filled 2019-09-16: qty 10

## 2019-09-16 MED ORDER — ONDANSETRON 4 MG PO TBDP: 4.0000 mg | ORAL_TABLET | Freq: Three times a day (TID) | ORAL | 2 refills | Status: AC | PRN

## 2019-09-16 MED ORDER — METOCLOPRAMIDE HCL 5 MG/ML IJ SOLN
10.0000 mg | Freq: Once | INTRAMUSCULAR | Status: AC
  Administered 2019-09-16: 10 mg via INTRAVENOUS
  Filled 2019-09-16: qty 2

## 2019-09-16 MED ORDER — SODIUM CHLORIDE 0.9 % IV BOLUS
1000.0000 mL | Freq: Once | INTRAVENOUS | Status: AC
  Administered 2019-09-16: 1000 mL via INTRAVENOUS

## 2019-09-16 MED ORDER — PROCHLORPERAZINE EDISYLATE 10 MG/2ML IJ SOLN
10.0000 mg | Freq: Once | INTRAMUSCULAR | Status: AC
  Administered 2019-09-16: 10 mg via INTRAVENOUS
  Filled 2019-09-16: qty 2

## 2019-09-16 MED ORDER — FAMOTIDINE IN NACL 20-0.9 MG/50ML-% IV SOLN
20.0000 mg | Freq: Once | INTRAVENOUS | Status: AC
  Administered 2019-09-16: 20 mg via INTRAVENOUS
  Filled 2019-09-16: qty 50

## 2019-09-16 NOTE — MAU Note (Signed)
Pt reports to MAU for nausea and vomiting since yesterday. Denies VB.

## 2019-09-16 NOTE — MAU Provider Note (Signed)
History     CSN: [id number]  Arrival date and time: 09/16/19 1605  Chief Complaint  Patient presents with  . Emesis During Pregnancy   Madison French is a 24 y.o. G2P2 at [redacted]w[redacted]d who presents to MAU as transfer via [facility] for N/V and abdominal pain. Patient was treated with IV NS and zofran at [facility]. Patient reports that nausea and vomiting is still present. Patient reports emesis 7+ times over the past 24 hours. She reports abdominal pain has been occurring for the past 2-3 days. Describes the abdominal pain as burning sensation in epigastric region and intermittent lower abdominal cramping. Rates pain 6/10- patient denies receiving any medication for abdominal pain while at [facility]. She denies vaginal bleeding or vaginal discharge at this time.    OB History    Gravida  2   Para  1   Term      Preterm  1   AB      Living  2     SAB      TAB      Ectopic      Multiple  1   Live Births              Past Medical History:  Diagnosis Date  . Allergic rhinitis   . Asthma   . Eczema   . Food allergy     Past Surgical History:  Procedure Laterality Date  . CESAREAN SECTION    . TYMPANOSTOMY TUBE PLACEMENT      Family History  Problem Relation Age of Onset  . Allergic rhinitis Mother   . Asthma Mother   . Multiple sclerosis Mother   . Angioedema Neg Hx   . Eczema Neg Hx   . Immunodeficiency Neg Hx   . Urticaria Neg Hx     Social History   Tobacco Use  . Smoking status: Former Smoker    Packs/day: 0.25    Years: 3.00    Pack years: 0.75    Types: Cigarettes  . Smokeless tobacco: Never Used  Substance Use Topics  . Alcohol use: No    Alcohol/week: 0.0 standard drinks  . Drug use: Yes    Types: Marijuana    Allergies:  Allergies  Allergen Reactions  . Cantaloupe Extract Allergy Skin Test Anaphylaxis  . Sesame Oil Anaphylaxis  . Citrullus Vulgaris Other (See Comments)    Unknown  Unknown   . Peanut-Containing Drug Products      All tree nuts  . Watermelon Flavor   . Other Rash    All tree nuts    Medications Prior to Admission  Medication Sig Dispense Refill Last Dose  . albuterol (PROVENTIL HFA) 108 (90 Base) MCG/ACT inhaler 2 puffs every 4 hours if needed for wheezing or coughing spells 18 g 1   . albuterol (PROVENTIL) (2.5 MG/3ML) 0.083% nebulizer solution Take 3 mLs (2.5 mg total) by nebulization every 4 (four) hours as needed for wheezing or shortness of breath. 75 mL 1   . Crisaborole (EUCRISA) 2 % OINT Apply 1 application topically 2 (two) times daily as needed (to red itchy areas). 100 g 5   . EPINEPHrine (EPIPEN 2-PAK) 0.3 mg/0.3 mL IJ SOAJ injection Use as directed for severe allergic reaction. 2 each 1   . fluticasone (FLONASE) 50 MCG/ACT nasal spray 2 sprays per nostril once a day for stuffy nose (Patient not taking: Reported on 11/23/2018) 16 g 5   . ipratropium (ATROVENT) 0.06 % nasal spray Two  sprays each nostril three times a day as needed for runny nose. (Patient not taking: Reported on 11/23/2018) 15 mL 5   . loratadine (CLARITIN) 10 MG tablet Take 1 tablet (10 mg total) by mouth daily as needed for allergies, rhinitis or itching. 30 tablet 5   . Olopatadine HCl (PAZEO) 0.7 % SOLN Place 1 drop into both eyes 1 day or 1 dose. 1 Bottle 5   . pimecrolimus (ELIDEL) 1 % cream Apply topically 2 (two) times daily. (Patient not taking: Reported on 11/17/2017) 30 g 3   . triamcinolone cream (KENALOG) 0.1 % Apply twice a day to red itchy areas below the face. 80 g 5     Review of Systems  Constitutional: Negative.   Respiratory: Negative.   Cardiovascular: Negative.   Gastrointestinal: Positive for abdominal pain, nausea and vomiting. Negative for constipation and diarrhea.  Genitourinary: Negative.   Musculoskeletal: Negative.   Neurological: Negative.   Psychiatric/Behavioral: Negative.    Physical Exam   Blood pressure (!) 93/54, pulse (!) 58, temperature 97.8 F (36.6 C), temperature source  Oral, resp. rate 16, height 5\' 7"  (1.702 m), weight 63.5 kg, last menstrual period 08/07/2019, SpO2 100 %.  Physical Exam  Nursing note and vitals reviewed. Constitutional: She is oriented to person, place, and time. She appears well-developed and well-nourished.  Cardiovascular: Normal rate and regular rhythm.  Respiratory: Effort normal and breath sounds normal. No respiratory distress. She has no wheezes.  GI: Soft. There is abdominal tenderness. There is no rebound and no guarding.  Epigastric tenderness   Musculoskeletal:        General: No edema. Normal range of motion.  Neurological: She is alert and oriented to person, place, and time.  Skin: Skin is warm and dry. There is pallor.  Mucous membranes dry   Psychiatric: She has a normal mood and affect. Her behavior is normal. Thought content normal.    MAU Course  Procedures  MDM Treatments in MAU included LR bolus, MVI, Pepcid IV and compazine IV in addition to 08/09/2019 to assess fetal status.   Ultrasound reviewed:  US OB LESS THAN 14 WEEKS WITH OB TRANSVAGINAL  Result Date: 09/16/2019 CLINICAL DATA:  Initial evaluation for acute nausea, vomiting, abdominal pain. Early pregnancy. EXAM: OBSTETRIC <14 WK 11/16/2019 AND TRANSVAGINAL OB US TECHNIQUE: Both transabdominal and transvaginal ultrasound examinations were performed for complete evaluation of the gestation as well as the maternal uterus, adnexal regions, and pelvic cul-de-sac. Transvaginal technique was performed to assess early pregnancy. COMPARISON:  None available. FINDINGS: Intrauterine gestational sac: Single Yolk sac:  Present Embryo:  Not visualized. Cardiac Activity: Negative. Heart Rate: N/A  bpm MSD: 12.1 mm   6 w   0 d Subchorionic hemorrhage:  None visualized. Maternal uterus/adnexae: Ovaries are normal in appearance bilaterally. Small volume free fluid noted within the pelvis. IMPRESSION: 1. Early intrauterine gestational sac with internal yolk sac, but no fetal pole or cardiac  activity yet visualized. Recommend follow-up quantitative B-HCG levels and follow-up US in 14 days to confirm and assess viability. 2. No other acute maternal uterine or adnexal abnormality identified. Electronically Signed   By: [place] M.D.   On: 09/16/2019 20:17   Discussed results of 11/16/2019 with patient. Discussed importance of follow up US in 10-14 days to assess viability, ordered placed for scheduling. Encouraged patient to keep follow up appointment as scheduled with [facility], patient verbalizes understanding.  PO challenge passed successfully.   Reassessment of pain. Patient reports pain is resolved  after treatments in MAU. Rx for zofran per patient request sent to pharmacy of choice, Rx for pepcid sent to pharmacy as well.  Discussed reasons to return to MAU. Return to MAU as needed. Pt stable at time of discharge.   Assessment and Plan   1. Nausea and vomiting during pregnancy   2. Abdominal pain during pregnancy in first trimester   3. Pelvic pain in pregnancy, antepartum, first trimester   4. [redacted] weeks gestation of pregnancy    Discharge home Follow up as scheduled in the office for prenatal care Return to MAU as needed for reasons discussed and/or emergencies  Rx for Zofran and pepcid   Follow-up Information    [facility] Follow up.   Specialty: Obstetrics and Gynecology Why: Follow up as scheduled for prenatal care and return to MAU as needed Contact information: [address] [place] [address] [phone]          Allergies as of 09/17/2019      Reactions   Cantaloupe Extract Allergy Skin Test Anaphylaxis   Sesame Oil Anaphylaxis   Citrullus Vulgaris Other (See Comments)   Unknown  Unknown    Peanut-containing Drug Products    All tree nuts   Watermelon Flavor    Other Rash   All tree nuts      Medication List    TAKE these medications   albuterol (2.5 MG/3ML) 0.083% nebulizer solution Commonly known as:  PROVENTIL Take 3 mLs (2.5 mg total) by nebulization every 4 (four) hours as needed for wheezing or shortness of breath.   albuterol 108 (90 Base) MCG/ACT inhaler Commonly known as: Proventil HFA 2 puffs every 4 hours if needed for wheezing or coughing spells   EPINEPHrine 0.3 mg/0.3 mL Soaj injection Commonly known as: EpiPen 2-Pak Use as directed for severe allergic reaction.   Eucrisa 2 % Oint Generic drug: Crisaborole Apply 1 application topically 2 (two) times daily as needed (to red itchy areas).   famotidine 20 MG tablet Commonly known as: PEPCID Take 1 tablet (20 mg total) by mouth 2 (two) times daily.   fluticasone 50 MCG/ACT nasal spray Commonly known as: FLONASE 2 sprays per nostril once a day for stuffy nose   ipratropium 0.06 % nasal spray Commonly known as: ATROVENT Two sprays each nostril three times a day as needed for runny nose.   loratadine 10 MG tablet Commonly known as: CLARITIN Take 1 tablet (10 mg total) by mouth daily as needed for allergies, rhinitis or itching.   Olopatadine HCl 0.7 % Soln Commonly known as: Pazeo Place 1 drop into both eyes 1 day or 1 dose.   ondansetron 4 MG disintegrating tablet Commonly known as: Zofran ODT Take 1 tablet (4 mg total) by mouth every 8 (eight) hours as needed for nausea or vomiting.   pimecrolimus 1 % cream Commonly known as: Elidel Apply topically 2 (two) times daily.   triamcinolone cream 0.1 % Commonly known as: KENALOG Apply twice a day to red itchy areas below the face.       Sharyon Cable CNM 09/16/2019, 11:42 PM

## 2019-09-16 NOTE — ED Provider Notes (Signed)
[facility] EMERGENCY DEPARTMENT Provider Note   CSN: [id number] Arrival date & time: 09/16/19  1605     History Chief Complaint  Patient presents with   Emesis During Pregnancy    Madison French is a 24 y.o. female G1P2, presenting with generalized abdominal pain and vomiting that began yesterday.  She states she recently found out she was pregnant on Sunday.  LMP 08/07/2019.  This is her second pregnancy, her first pregnancy she had multiple gestation with twins, delivered by C-section at 36 weeks without complications.  Denies any complications during her pregnancy as well.  She states she did have nausea vomiting with her first pregnancy, however not this severe.  She has vomited numerous times since yesterday.  Her abdominal exam is generalized.  No fevers or chills, no URI symptoms or urinary symptoms.  She is been evaluated by her OB at [facility] for this pregnancy.  Denies vaginal bleeding or abnormal discharge.  The history is provided by the patient.       Past Medical History:  Diagnosis Date   Allergic rhinitis    Asthma    Eczema    Food allergy     Patient Active Problem List   Diagnosis Date Noted   Hypotension 11/23/2018   Tobacco use 11/23/2018   Mild persistent asthma without complication 08/31/2016   Seasonal allergic conjunctivitis 08/31/2016   Seasonal allergic rhinitis due to pollen 08/31/2016   Mild intermittent asthma 09/01/2015   Other allergic rhinitis 09/01/2015   Food allergy 09/01/2015   Flexural atopic dermatitis 09/01/2015   Breast mass, right 06/02/2015   Chronic nonintractable headache 06/02/2015   Flu vaccine need 06/02/2015   Keloid of skin 06/02/2015   Vitamin D deficiency 06/02/2015    Past Surgical History:  Procedure Laterality Date   TYMPANOSTOMY TUBE PLACEMENT       OB History    Gravida  2   Para  1   Term      Preterm  1   AB      Living        SAB      TAB      Ectopic     Multiple  1   Live Births              Family History  Problem Relation Age of Onset   Allergic rhinitis Mother    Asthma Mother    Multiple sclerosis Mother    Angioedema Neg Hx    Eczema Neg Hx    Immunodeficiency Neg Hx    Urticaria Neg Hx     Social History   Tobacco Use   Smoking status: Current Every Day Smoker    Packs/day: 0.25    Years: 3.00    Pack years: 0.75    Types: Cigarettes   Smokeless tobacco: Never Used  Substance Use Topics   Alcohol use: No    Alcohol/week: 0.0 standard drinks   Drug use: No    Home Medications Prior to Admission medications   Medication Sig Start Date End Date Taking? Authorizing Provider  albuterol (PROVENTIL HFA) 108 (90 Base) MCG/ACT inhaler 2 puffs every 4 hours if needed for wheezing or coughing spells 11/23/18   Bobbitt, Heywood Iles, MD  albuterol (PROVENTIL) (2.5 MG/3ML) 0.083% nebulizer solution Take 3 mLs (2.5 mg total) by nebulization every 4 (four) hours as needed for wheezing or shortness of breath. 11/17/17   Bobbitt, Heywood Iles, MD  Crisaborole (EUCRISA) 2 % OINT  Apply 1 application topically 2 (two) times daily as needed (to red itchy areas). 11/23/18   Bobbitt, Sedalia Muta, MD  EPINEPHrine (EPIPEN 2-PAK) 0.3 mg/0.3 mL IJ SOAJ injection Use as directed for severe allergic reaction. 11/23/18   Bobbitt, Sedalia Muta, MD  fluticasone Asencion Islam) 50 MCG/ACT nasal spray 2 sprays per nostril once a day for stuffy nose Patient not taking: Reported on 11/23/2018 11/17/17   Bobbitt, Sedalia Muta, MD  ipratropium (ATROVENT) 0.06 % nasal spray Two sprays each nostril three times a day as needed for runny nose. Patient not taking: Reported on 11/23/2018 11/17/17   Bobbitt, Sedalia Muta, MD  loratadine (CLARITIN) 10 MG tablet Take 1 tablet (10 mg total) by mouth daily as needed for allergies, rhinitis or itching. 11/17/17   Bobbitt, Sedalia Muta, MD  Olopatadine HCl (PAZEO) 0.7 % SOLN Place 1 drop into both eyes 1 day or 1  dose. 11/17/17   Bobbitt, Sedalia Muta, MD  pimecrolimus (ELIDEL) 1 % cream Apply topically 2 (two) times daily. Patient not taking: Reported on 11/17/2017 09/01/15   Leda Roys, MD  triamcinolone cream (KENALOG) 0.1 % Apply twice a day to red itchy areas below the face. 11/17/17   Bobbitt, Sedalia Muta, MD    Allergies    Cantaloupe extract allergy skin test, Sesame oil, Citrullus vulgaris, Peanut-containing drug products, Watermelon flavor, and Other  Review of Systems   Review of Systems  Gastrointestinal: Positive for abdominal pain, nausea and vomiting.  All other systems reviewed and are negative.   Physical Exam Updated Vital Signs BP 100/64 (BP Location: Right Arm)    Pulse 76    Temp 97.7 F (36.5 C) (Oral)    Resp 20    Ht 5\' 7"  (1.702 m)    Wt 63.5 kg    LMP 08/07/2019    SpO2 100%    BMI 21.93 kg/m   Physical Exam Vitals and nursing note reviewed.  Constitutional:      Appearance: She is well-developed. She is diaphoretic.     Comments: Pt is pale and diaphoretic  HENT:     Head: Normocephalic and atraumatic.  Eyes:     Conjunctiva/sclera: Conjunctivae normal.  Cardiovascular:     Rate and Rhythm: Normal rate and regular rhythm.  Pulmonary:     Effort: Pulmonary effort is normal. No respiratory distress.     Breath sounds: Normal breath sounds.  Abdominal:     General: Bowel sounds are normal.     Palpations: Abdomen is soft.     Tenderness: There is abdominal tenderness in the right lower quadrant, periumbilical area, suprapubic area and left lower quadrant. There is no guarding or rebound.  Skin:    General: Skin is warm.  Neurological:     Mental Status: She is alert.  Psychiatric:        Behavior: Behavior normal.     ED Results / Procedures / Treatments   Labs (all labs ordered are listed, but only abnormal results are displayed) Labs Reviewed  COMPREHENSIVE METABOLIC PANEL - Abnormal; Notable for the following components:      Result Value    Potassium 3.3 (*)    CO2 17 (*)    Glucose, Bld 143 (*)    Total Protein 8.5 (*)    All other components within normal limits  CBC - Abnormal; Notable for the following components:   WBC 12.3 (*)    All other components within normal limits  HCG, QUANTITATIVE, PREGNANCY - Abnormal; Notable for  the following components:   hCG, Beta Chain, Quant, S 26,071 (*)    All other components within normal limits  LIPASE, BLOOD  URINALYSIS, ROUTINE W REFLEX MICROSCOPIC    EKG None  Radiology No results found.  Procedures Procedures (including critical care time)  Medications Ordered in ED Medications  ondansetron (ZOFRAN) injection 4 mg (4 mg Intravenous Given 09/16/19 1620)  sodium chloride 0.9 % bolus 1,000 mL (1,000 mLs Intravenous New Bag/Given 09/16/19 1714)  metoCLOPramide (REGLAN) injection 10 mg (10 mg Intravenous Given 09/16/19 1714)    ED Course  I have reviewed the triage vital signs and the nursing notes.  Pertinent labs & imaging results that were available during my care of the patient were reviewed by me and considered in my medical decision making (see chart for details).  Clinical Course as of Sep 16 1755  Sun Sep 16, 2019  1748 Dr. Earlene Plater accepting transfer to [facility]   [JR]    Clinical Course User Index [JR] Zamari Vea, [place] N, PA-C   MDM Rules/Calculators/A&P                      Patient is G2P2, presenting with vomiting and abdominal pain since yesterday.  LMP 08/07/2019.  Her first pregnancy was uncomplicated, she gave birth to twins at 19 weeks via C-section.  On exam, she is diaphoretic and pale appearing.  Vital signs are stable.  Abdomen is tender mostly to the lower quadrants.  No guarding or rebound.  Denies vaginal bleeding or discharge.  Patient treated with IV fluids and antiemetics.  hCG quant is 26,000.  Mild leukocytosis of 12.3.  Ultrasound is not currently available at this facility.  Believe patient needs imaging to rule out ectopic.  Call placed to Dr.  Earlene Plater, [facility] on-call at the [facility].  Dr. Earlene Plater is accepting transfer for further evaluation.  Final Clinical Impression(s) / ED Diagnoses Final diagnoses:  Abdominal pain during pregnancy in first trimester  Nausea and vomiting in pregnancy    Rx / DC Orders ED Discharge Orders    None       Lashon Beringer, [place] N, PA-C 09/16/19 1757    Rolan Bucco, MD 09/16/19 2037

## 2019-09-16 NOTE — ED Triage Notes (Signed)
Pt reports she is [redacted] weeks pregnant. C/o abd pain and vomiting since yesterday.

## 2019-09-17 ENCOUNTER — Other Ambulatory Visit: Payer: Self-pay

## 2019-09-17 ENCOUNTER — Encounter (HOSPITAL_BASED_OUTPATIENT_CLINIC_OR_DEPARTMENT_OTHER): Payer: Self-pay

## 2019-09-17 ENCOUNTER — Emergency Department (HOSPITAL_BASED_OUTPATIENT_CLINIC_OR_DEPARTMENT_OTHER)
Admission: EM | Admit: 2019-09-17 | Discharge: 2019-09-17 | Disposition: A | Discharge status: Home / Self Care | Payer: Medicaid Other | Attending: Emergency Medicine | Admitting: Emergency Medicine

## 2019-09-17 DIAGNOSIS — Z7951 Long term (current) use of inhaled steroids: Secondary | ICD-10-CM | POA: Insufficient documentation

## 2019-09-17 DIAGNOSIS — R1084 Generalized abdominal pain: Secondary | ICD-10-CM | POA: Insufficient documentation

## 2019-09-17 DIAGNOSIS — O99511 Diseases of the respiratory system complicating pregnancy, first trimester: Secondary | ICD-10-CM | POA: Insufficient documentation

## 2019-09-17 DIAGNOSIS — Z79899 Other long term (current) drug therapy: Secondary | ICD-10-CM | POA: Insufficient documentation

## 2019-09-17 DIAGNOSIS — O219 Vomiting of pregnancy, unspecified: Secondary | ICD-10-CM

## 2019-09-17 DIAGNOSIS — F17210 Nicotine dependence, cigarettes, uncomplicated: Secondary | ICD-10-CM | POA: Insufficient documentation

## 2019-09-17 DIAGNOSIS — O99331 Smoking (tobacco) complicating pregnancy, first trimester: Secondary | ICD-10-CM | POA: Insufficient documentation

## 2019-09-17 DIAGNOSIS — Z91010 Allergy to peanuts: Secondary | ICD-10-CM | POA: Insufficient documentation

## 2019-09-17 DIAGNOSIS — J45909 Unspecified asthma, uncomplicated: Secondary | ICD-10-CM | POA: Insufficient documentation

## 2019-09-17 DIAGNOSIS — R112 Nausea with vomiting, unspecified: Secondary | ICD-10-CM

## 2019-09-17 DIAGNOSIS — Z3A01 Less than 8 weeks gestation of pregnancy: Secondary | ICD-10-CM | POA: Insufficient documentation

## 2019-09-17 DIAGNOSIS — O99321 Drug use complicating pregnancy, first trimester: Secondary | ICD-10-CM | POA: Insufficient documentation

## 2019-09-17 DIAGNOSIS — F1290 Cannabis use, unspecified, uncomplicated: Secondary | ICD-10-CM | POA: Insufficient documentation

## 2019-09-17 LAB — HCG, QUANTITATIVE, PREGNANCY: hCG, Beta Chain, Quant, S: 26961 m[IU]/mL — ABNORMAL HIGH (ref <5)

## 2019-09-17 LAB — BASIC METABOLIC PANEL
Anion gap: 13 (ref 5–15)
BUN: 6 mg/dL (ref 6–20)
CO2: 19 mmol/L — ABNORMAL LOW (ref 22–32)
Calcium: 9.2 mg/dL (ref 8.9–10.3)
Chloride: 102 mmol/L (ref 98–111)
Creatinine, Ser: 0.65 mg/dL (ref 0.44–1.00)
GFR calc Af Amer: >60 mL/min (ref >60)
GFR calc non Af Amer: >60 mL/min (ref >60)
Glucose, Bld: 116 mg/dL — ABNORMAL HIGH (ref 70–99)
Potassium: 2.8 mmol/L — ABNORMAL LOW (ref 3.5–5.1)
Sodium: 134 mmol/L — ABNORMAL LOW (ref 135–145)

## 2019-09-17 MED ORDER — POTASSIUM CHLORIDE 10 MEQ/100ML IV SOLN
10.0000 meq | Freq: Once | INTRAVENOUS | Status: AC
  Administered 2019-09-17: 10 meq via INTRAVENOUS
  Filled 2019-09-17: qty 100

## 2019-09-17 MED ORDER — PROCHLORPERAZINE MALEATE 10 MG PO TABS
10.0000 mg | ORAL_TABLET | Freq: Two times a day (BID) | ORAL | 0 refills | Status: DC | PRN
Start: 2019-09-17 — End: 2021-10-16

## 2019-09-17 MED ORDER — PROCHLORPERAZINE EDISYLATE 10 MG/2ML IJ SOLN: 10.0000 mg | Freq: Four times a day (QID) | INTRAMUSCULAR | Status: DC | PRN

## 2019-09-17 MED ORDER — POTASSIUM CHLORIDE CRYS ER 20 MEQ PO TBCR
20.0000 meq | EXTENDED_RELEASE_TABLET | Freq: Once | ORAL | Status: AC
  Administered 2019-09-17: 20 meq via ORAL
  Filled 2019-09-17: qty 1

## 2019-09-17 MED ORDER — SODIUM CHLORIDE 0.9 % IV SOLN: Freq: Once | INTRAVENOUS | Status: AC

## 2019-09-17 MED ORDER — LACTATED RINGERS IV BOLUS
1000.0000 mL | Freq: Once | INTRAVENOUS | Status: AC
  Administered 2019-09-17: 1000 mL via INTRAVENOUS

## 2019-09-17 MED ORDER — POTASSIUM CHLORIDE CRYS ER 20 MEQ PO TBCR: 20.0000 meq | EXTENDED_RELEASE_TABLET | Freq: Two times a day (BID) | ORAL | 0 refills | Status: AC

## 2019-09-17 MED FILL — Ondansetron HCl Inj 4 MG/2ML (2 MG/ML): INTRAMUSCULAR | Qty: 2 | Status: AC

## 2019-09-17 NOTE — ED Provider Notes (Signed)
[facility] EMERGENCY DEPARTMENT Provider Note   CSN: [id number] Arrival date & time: 09/17/19  1503     History Chief Complaint  Patient presents with  . Abdominal Pain    Madison French is a 24 y.o. female.  HPI Patient presents to the ED for re-evaluation of vomiting and abdominal pain. She is approx [redacted] weeks pregnant (G2P2) based on US done at [facility] yesterday. She had a gestational sac but no fetal pole then. She was initially seen at [facility] where labs were unremarkable. Notes from yesterday indicate the patient felt better after IVF and compazine, but patient states she has continued to have multiple episodes of vomiting and diffuse cramping abdominal pain since discharge. She denies vaginal bleeding or discharge. She admits to frequent marijuana use. She denies history of vomiting/abdominal pain pre-pregnancy but she did have a visit to [facility] for similar symptoms in March 2021 before she was pregnant.     Past Medical History:  Diagnosis Date  . Allergic rhinitis   . Asthma   . Eczema   . Food allergy     Patient Active Problem List   Diagnosis Date Noted  . Hypotension 11/23/2018  . Tobacco use 11/23/2018  . Mild persistent asthma without complication 08/31/2016  . Seasonal allergic conjunctivitis 08/31/2016  . Seasonal allergic rhinitis due to pollen 08/31/2016  . Mild intermittent asthma 09/01/2015  . Other allergic rhinitis 09/01/2015  . Food allergy 09/01/2015  . Flexural atopic dermatitis 09/01/2015  . Breast mass, right 06/02/2015  . Chronic nonintractable headache 06/02/2015  . Flu vaccine need 06/02/2015  . Keloid of skin 06/02/2015  . Vitamin D deficiency 06/02/2015    Past Surgical History:  Procedure Laterality Date  . CESAREAN SECTION    . TYMPANOSTOMY TUBE PLACEMENT       OB History    Gravida  2   Para  1   Term      Preterm  1   AB      Living  2     SAB      TAB      Ectopic      Multiple  1   Live Births             Family History  Problem Relation Age of Onset  . Allergic rhinitis Mother   . Asthma Mother   . Multiple sclerosis Mother   . Angioedema Neg Hx   . Eczema Neg Hx   . Immunodeficiency Neg Hx   . Urticaria Neg Hx     Social History   Tobacco Use  . Smoking status: Former Smoker    Packs/day: 0.25    Years: 3.00    Pack years: 0.75    Types: Cigarettes  . Smokeless tobacco: Never Used  Substance Use Topics  . Alcohol use: No    Alcohol/week: 0.0 standard drinks  . Drug use: Yes    Types: Marijuana    Comment: last smoked 09/13/2019    Home Medications Prior to Admission medications   Medication Sig Start Date End Date Taking? Authorizing Provider  albuterol (PROVENTIL HFA) 108 (90 Base) MCG/ACT inhaler 2 puffs every 4 hours if needed for wheezing or coughing spells 11/23/18   Bobbitt, Heywood Iles, MD  albuterol (PROVENTIL) (2.5 MG/3ML) 0.083% nebulizer solution Take 3 mLs (2.5 mg total) by nebulization every 4 (four) hours as needed for wheezing or shortness of breath. 11/17/17   Bobbitt, Heywood Iles, MD  Crisaborole (EUCRISA) 2 % OINT  Apply 1 application topically 2 (two) times daily as needed (to red itchy areas). 11/23/18   Bobbitt, Heywood Iles, MD  EPINEPHrine (EPIPEN 2-PAK) 0.3 mg/0.3 mL IJ SOAJ injection Use as directed for severe allergic reaction. 11/23/18   Bobbitt, Heywood Iles, MD  famotidine (PEPCID) 20 MG tablet Take 1 tablet (20 mg total) by mouth 2 (two) times daily. 09/16/19   Sharyon Cable, CNM  fluticasone Aleda Grana) 50 MCG/ACT nasal spray 2 sprays per nostril once a day for stuffy nose Patient not taking: Reported on 11/23/2018 11/17/17   Bobbitt, Heywood Iles, MD  ipratropium (ATROVENT) 0.06 % nasal spray Two sprays each nostril three times a day as needed for runny nose. Patient not taking: Reported on 11/23/2018 11/17/17   Bobbitt, Heywood Iles, MD  loratadine (CLARITIN) 10 MG tablet Take 1 tablet (10 mg total) by mouth daily as needed for allergies,  rhinitis or itching. 11/17/17   Bobbitt, Heywood Iles, MD  Olopatadine HCl (PAZEO) 0.7 % SOLN Place 1 drop into both eyes 1 day or 1 dose. 11/17/17   Bobbitt, Heywood Iles, MD  ondansetron (ZOFRAN ODT) 4 MG disintegrating tablet Take 1 tablet (4 mg total) by mouth every 8 (eight) hours as needed for nausea or vomiting. 09/16/19   Sharyon Cable, CNM  pimecrolimus (ELIDEL) 1 % cream Apply topically 2 (two) times daily. Patient not taking: Reported on 11/17/2017 09/01/15   Mikki Santee, MD  potassium chloride SA (KLOR-CON) 20 MEQ tablet Take 1 tablet (20 mEq total) by mouth 2 (two) times daily for 5 days. 09/17/19 09/22/19  Pollyann Savoy, MD  prochlorperazine (COMPAZINE) 10 MG tablet Take 1 tablet (10 mg total) by mouth 2 (two) times daily as needed for nausea or vomiting. 09/17/19   Pollyann Savoy, MD  triamcinolone cream (KENALOG) 0.1 % Apply twice a day to red itchy areas below the face. 11/17/17   Bobbitt, Heywood Iles, MD    Allergies    Cantaloupe extract allergy skin test, Citrullus vulgaris, Peanut-containing drug products, Sesame seed (diagnostic), Watermelon flavor, and Other  Review of Systems   Review of Systems A comprehensive review of systems was completed and negative except as noted in HPI.   Physical Exam Updated Vital Signs BP 98/82 (BP Location: Right Arm)   Pulse (!) 55   Temp 98.5 F (36.9 C) (Oral)   Resp 16   LMP 08/07/2019   SpO2 100%   Physical Exam Vitals and nursing note reviewed.  Constitutional:      Appearance: Normal appearance.  HENT:     Head: Normocephalic and atraumatic.     Nose: Nose normal.     Mouth/Throat:     Mouth: Mucous membranes are moist.  Eyes:     Extraocular Movements: Extraocular movements intact.     Conjunctiva/sclera: Conjunctivae normal.  Cardiovascular:     Rate and Rhythm: Normal rate.  Pulmonary:     Effort: Pulmonary effort is normal.     Breath sounds: Normal breath sounds.  Abdominal:     General: Abdomen is flat.       Palpations: Abdomen is soft.     Tenderness: There is generalized abdominal tenderness. There is no guarding. Negative signs include Murphy's sign and McBurney's sign.  Musculoskeletal:        General: No swelling. Normal range of motion.     Cervical back: Neck supple.  Skin:    General: Skin is warm and dry.  Neurological:     General: No focal  deficit present.     Mental Status: She is alert.  Psychiatric:        Mood and Affect: Mood normal.     ED Results / Procedures / Treatments   Labs (all labs ordered are listed, but only abnormal results are displayed) Labs Reviewed  BASIC METABOLIC PANEL - Abnormal; Notable for the following components:      Result Value   Sodium 134 (*)    Potassium 2.8 (*)    CO2 19 (*)    Glucose, Bld 116 (*)    All other components within normal limits  HCG, QUANTITATIVE, PREGNANCY - Abnormal; Notable for the following components:   hCG, Beta Chain, Quant, S 26,961 (*)    All other components within normal limits    EKG None  Radiology US OB LESS THAN 14 WEEKS WITH OB TRANSVAGINAL  Result Date: 09/16/2019 CLINICAL DATA:  Initial evaluation for acute nausea, vomiting, abdominal pain. Early pregnancy. EXAM: OBSTETRIC <14 WK [place] AND TRANSVAGINAL OB US TECHNIQUE: Both transabdominal and transvaginal ultrasound examinations were performed for complete evaluation of the gestation as well as the maternal uterus, adnexal regions, and pelvic cul-de-sac. Transvaginal technique was performed to assess early pregnancy. COMPARISON:  None available. FINDINGS: Intrauterine gestational sac: Single Yolk sac:  Present Embryo:  Not visualized. Cardiac Activity: Negative. Heart Rate: N/A  bpm MSD: 12.1 mm   6 w   0 d Subchorionic hemorrhage:  None visualized. Maternal uterus/adnexae: Ovaries are normal in appearance bilaterally. Small volume free fluid noted within the pelvis. IMPRESSION: 1. Early intrauterine gestational sac with internal yolk sac, but no fetal  pole or cardiac activity yet visualized. Recommend follow-up quantitative B-HCG levels and follow-up US in 14 days to confirm and assess viability. 2. No other acute maternal uterine or adnexal abnormality identified. Electronically Signed   By: Jeannine Boga M.D.   On: 09/16/2019 20:17    Procedures Procedures (including critical care time)  Medications Ordered in ED Medications  prochlorperazine (COMPAZINE) injection 10 mg (has no administration in time range)  lactated ringers bolus 1,000 mL ( Intravenous Stopped 09/17/19 1656)  potassium chloride 10 mEq in 100 mL IVPB (0 mEq Intravenous Stopped 09/17/19 1804)  potassium chloride SA (KLOR-CON) CR tablet 20 mEq (20 mEq Oral Given 09/17/19 1642)  0.9 %  sodium chloride infusion ( Intravenous Stopped 09/17/19 1804)    ED Course  I have reviewed the triage vital signs and the nursing notes.  Pertinent labs & imaging results that were available during my care of the patient were reviewed by me and considered in my medical decision making (see chart for details).  Clinical Course as of Sep 16 2120  Mon Sep 17, 2019  1624 K mildly low, will replete via IV and then orally when able to tolerate.    [CS]  1735 Quant increased slightly from yesterday's value.   [CS]  2841 Patient feeling better, tolerating PO fluids and has taken oral Potassium without difficulty.    [CS]  3244 Will dispo with Rx for Compazine as this seems to help her nausea most. Short Potassium repletion at home. Ob/Gyn followup. Again advised to stop smoking marijuana.    [CS]    Clinical Course User Index [CS] Truddie Hidden, MD   MDM Rules/Calculators/A&P                      Given continued vomiting, will recheck BMP, recheck quant given no fetal pole on [place] yesterday. I  suspect that her symptoms are due to marijuana use and not due to pregnancy/hyperemesis. She was counseled to stop smoking marijuana and understands that this could be exacerbating her symptoms.    Final Clinical Impression(s) / ED Diagnoses Final diagnoses:  Cannabinoid hyperemesis syndrome  Nausea and vomiting in pregnancy    Rx / DC Orders ED Discharge Orders         Ordered    prochlorperazine (COMPAZINE) 10 MG tablet  2 times daily PRN     09/17/19 1754    potassium chloride SA (KLOR-CON) 20 MEQ tablet  2 times daily     09/17/19 1754           Pollyann Savoy, MD 09/17/19 2122

## 2019-09-17 NOTE — ED Triage Notes (Signed)
Pt c/o abd pain, n/v-states she is ~[redacted] weeks pregnant-was admitted to [facility] yesterday for same c/o-pt reports no relief with zofran and pepcid-to triage in w/c

## 2019-09-18 ENCOUNTER — Telehealth: Payer: Self-pay

## 2019-09-18 NOTE — Telephone Encounter (Signed)
Left VM on nurse line requesting a call back. Per chart review, pt has not been seen in our office. Was seen in [facility] on 09/16/19. Called pt; VM left with callback number and encouraged pt to call and make an appt if she has a need.

## 2019-09-19 ENCOUNTER — Other Ambulatory Visit: Payer: Self-pay

## 2019-09-19 ENCOUNTER — Emergency Department (HOSPITAL_BASED_OUTPATIENT_CLINIC_OR_DEPARTMENT_OTHER)
Admission: EM | Admit: 2019-09-19 | Discharge: 2019-09-19 | Disposition: A | Discharge status: Home / Self Care | Payer: Medicaid Other | Attending: Emergency Medicine | Admitting: Emergency Medicine

## 2019-09-19 ENCOUNTER — Encounter (HOSPITAL_BASED_OUTPATIENT_CLINIC_OR_DEPARTMENT_OTHER): Payer: Self-pay

## 2019-09-19 DIAGNOSIS — Z87891 Personal history of nicotine dependence: Secondary | ICD-10-CM | POA: Diagnosis not present

## 2019-09-19 DIAGNOSIS — Z91010 Allergy to peanuts: Secondary | ICD-10-CM | POA: Diagnosis not present

## 2019-09-19 DIAGNOSIS — Z3A01 Less than 8 weeks gestation of pregnancy: Secondary | ICD-10-CM | POA: Insufficient documentation

## 2019-09-19 DIAGNOSIS — O219 Vomiting of pregnancy, unspecified: Secondary | ICD-10-CM | POA: Diagnosis present

## 2019-09-19 DIAGNOSIS — E860 Dehydration: Secondary | ICD-10-CM | POA: Diagnosis not present

## 2019-09-19 DIAGNOSIS — E876 Hypokalemia: Secondary | ICD-10-CM | POA: Insufficient documentation

## 2019-09-19 DIAGNOSIS — Z79899 Other long term (current) drug therapy: Secondary | ICD-10-CM | POA: Insufficient documentation

## 2019-09-19 DIAGNOSIS — J45909 Unspecified asthma, uncomplicated: Secondary | ICD-10-CM | POA: Diagnosis not present

## 2019-09-19 DIAGNOSIS — R112 Nausea with vomiting, unspecified: Secondary | ICD-10-CM

## 2019-09-19 DIAGNOSIS — O99511 Diseases of the respiratory system complicating pregnancy, first trimester: Secondary | ICD-10-CM | POA: Diagnosis not present

## 2019-09-19 LAB — CBC WITH DIFFERENTIAL/PLATELET
Abs Immature Granulocytes: 0.1 10*3/uL — ABNORMAL HIGH (ref 0.00–0.07)
Basophils Absolute: 0 10*3/uL (ref 0.0–0.1)
Basophils Relative: 0 %
Eosinophils Absolute: 0 10*3/uL (ref 0.0–0.5)
Eosinophils Relative: 0 %
HCT: 41.2 % (ref 36.0–46.0)
Hemoglobin: 14.4 g/dL (ref 12.0–15.0)
Immature Granulocytes: 1 %
Lymphocytes Relative: 15 %
Lymphs Abs: 2.7 10*3/uL (ref 0.7–4.0)
MCH: 33 pg (ref 26.0–34.0)
MCHC: 35 g/dL (ref 30.0–36.0)
MCV: 94.3 fL (ref 80.0–100.0)
Monocytes Absolute: 1 10*3/uL (ref 0.1–1.0)
Monocytes Relative: 6 %
Neutro Abs: 13.8 10*3/uL — ABNORMAL HIGH (ref 1.7–7.7)
Neutrophils Relative %: 78 %
Platelets: 391 10*3/uL (ref 150–400)
RBC: 4.37 MIL/uL (ref 3.87–5.11)
RDW: 11.9 % (ref 11.5–15.5)
WBC: 17.8 10*3/uL — ABNORMAL HIGH (ref 4.0–10.5)
nRBC: 0 % (ref 0.0–0.2)

## 2019-09-19 LAB — COMPREHENSIVE METABOLIC PANEL
ALT: 17 U/L (ref 0–44)
AST: 15 U/L (ref 15–41)
Albumin: 5.2 g/dL — ABNORMAL HIGH (ref 3.5–5.0)
Alkaline Phosphatase: 44 U/L (ref 38–126)
Anion gap: 13 (ref 5–15)
BUN: 7 mg/dL (ref 6–20)
CO2: 22 mmol/L (ref 22–32)
Calcium: 9.7 mg/dL (ref 8.9–10.3)
Chloride: 97 mmol/L — ABNORMAL LOW (ref 98–111)
Creatinine, Ser: 0.74 mg/dL (ref 0.44–1.00)
GFR calc Af Amer: >60 mL/min (ref >60)
GFR calc non Af Amer: >60 mL/min (ref >60)
Glucose, Bld: 92 mg/dL (ref 70–99)
Potassium: 2.6 mmol/L — CL (ref 3.5–5.1)
Sodium: 132 mmol/L — ABNORMAL LOW (ref 135–145)
Total Bilirubin: 1.2 mg/dL (ref 0.3–1.2)
Total Protein: 8.5 g/dL — ABNORMAL HIGH (ref 6.5–8.1)

## 2019-09-19 LAB — MAGNESIUM: Magnesium: 1.6 mg/dL — ABNORMAL LOW (ref 1.7–2.4)

## 2019-09-19 LAB — LIPASE, BLOOD: Lipase: 32 U/L (ref 11–51)

## 2019-09-19 MED ORDER — MAGNESIUM 30 MG PO TABS
30.0000 mg | ORAL_TABLET | Freq: Two times a day (BID) | ORAL | 0 refills | Status: DC
Start: 2019-09-19 — End: 2019-11-28

## 2019-09-19 MED ORDER — PROMETHAZINE HCL 25 MG/ML IJ SOLN
12.5000 mg | Freq: Once | INTRAMUSCULAR | Status: AC
  Administered 2019-09-19: 12.5 mg via INTRAVENOUS
  Filled 2019-09-19: qty 1

## 2019-09-19 MED ORDER — POTASSIUM CHLORIDE 10 MEQ/100ML IV SOLN
10.0000 meq | INTRAVENOUS | Status: DC
  Administered 2019-09-19: 10 meq via INTRAVENOUS

## 2019-09-19 MED ORDER — LACTATED RINGERS IV BOLUS
1000.0000 mL | Freq: Once | INTRAVENOUS | Status: AC
  Administered 2019-09-19: 1000 mL via INTRAVENOUS

## 2019-09-19 MED ORDER — POTASSIUM CHLORIDE CRYS ER 20 MEQ PO TBCR
40.0000 meq | EXTENDED_RELEASE_TABLET | Freq: Once | ORAL | Status: AC
  Administered 2019-09-19: 40 meq via ORAL
  Filled 2019-09-19: qty 2

## 2019-09-19 MED ORDER — PROMETHAZINE HCL 25 MG PO TABS: 25.0000 mg | ORAL_TABLET | Freq: Four times a day (QID) | ORAL | 0 refills | Status: AC | PRN

## 2019-09-19 MED ORDER — VITAMIN B-6 25 MG PO TABS
25.0000 mg | ORAL_TABLET | Freq: Two times a day (BID) | ORAL | 0 refills | Status: DC
Start: 2019-09-19 — End: 2019-11-28

## 2019-09-19 MED ORDER — POTASSIUM CHLORIDE 10 MEQ/100ML IV SOLN
10.0000 meq | INTRAVENOUS | Status: DC
  Administered 2019-09-19: 10 meq via INTRAVENOUS
  Filled 2019-09-19 (×2): qty 100

## 2019-09-19 MED ORDER — ACETAMINOPHEN 325 MG PO TABS
650.0000 mg | ORAL_TABLET | Freq: Once | ORAL | Status: DC
  Filled 2019-09-19: qty 2

## 2019-09-19 NOTE — ED Triage Notes (Signed)
Pt c/o cont'd abd pain,n/v-states she is ~[redacted] weeks pregnant-has been seen at [facility], here and [facility] ED for same c/o since 6/6-states no relief with any meds she has been given-NAD-to triage in w/c

## 2019-09-19 NOTE — Discharge Instructions (Addendum)
Please take your potassium supplements as prescribed.  Please use Phenergan for nausea as we discussed.  This may be used either by inserting into your vagina or rectum or by taking orally if you are able to tolerate it.  Please use your potassium supplements that you have already been prescribed.  I have also prescribed you some magnesium supplements which I would like you to take.  [facility] as [facility]  You may also take B vitamins and ginger.  This may help with nausea.  I have written a prescription for this.

## 2019-09-19 NOTE — ED Provider Notes (Signed)
[facility] EMERGENCY DEPARTMENT Provider Note   CSN: [id number] Arrival date & time: 09/19/19  1308     History Chief Complaint  Patient presents with   Abdominal Pain    Madison French is a 24 y.o. female.  HPI Patient is a 24 year old female who is G2P2 123w1d 0 current pregnancy presented today for nausea and vomiting.  She denies any abdominal pain, vaginal discharge, vaginal bleeding, cramping diarrhea lightheadedness, dizziness, chest pain, shortness of breath, headache or dizziness.  She states that she has not been able to eat much at all over the past few days.  She states that she was given Compazine tablets during her last ED evaluation.   Patient states that her symptoms have not changed medically since [facility]  She states that she is coming in today because she does not feel that the medication she was given for nausea is working.     Past Medical History:  Diagnosis Date   Allergic rhinitis    Asthma    Eczema    Food allergy     Patient Active Problem List   Diagnosis Date Noted   Hypotension 11/23/2018   Tobacco use 11/23/2018   Mild persistent asthma without complication 08/31/2016   Seasonal allergic conjunctivitis 08/31/2016   Seasonal allergic rhinitis due to pollen 08/31/2016   Mild intermittent asthma 09/01/2015   Other allergic rhinitis 09/01/2015   Food allergy 09/01/2015   Flexural atopic dermatitis 09/01/2015   Breast mass, right 06/02/2015   Chronic nonintractable headache 06/02/2015   Flu vaccine need 06/02/2015   Keloid of skin 06/02/2015   Vitamin D deficiency 06/02/2015    Past Surgical History:  Procedure Laterality Date   CESAREAN SECTION     TYMPANOSTOMY TUBE PLACEMENT       OB History    Gravida  2   Para  1   Term      Preterm  1   AB      Living  2     SAB      TAB      Ectopic      Multiple  1   Live Births              Family  History  Problem Relation Age of Onset   Allergic rhinitis Mother    Asthma Mother    Multiple sclerosis Mother    Angioedema Neg Hx    Eczema Neg Hx    Immunodeficiency Neg Hx    Urticaria Neg Hx     Social History   Tobacco Use   Smoking status: Former Smoker    Packs/day: 0.25    Years: 3.00    Pack years: 0.75    Types: Cigarettes   Smokeless tobacco: Never Used  Substance Use Topics   Alcohol use: No    Alcohol/week: 0.0 standard drinks   Drug use: Yes    Types: Marijuana    Comment: last smoked "couple days"    Home Medications Prior to Admission medications   Medication Sig Start Date End Date Taking? Authorizing Provider  albuterol (PROVENTIL HFA) 108 (90 Base) MCG/ACT inhaler 2 puffs every 4 hours if needed for wheezing or coughing spells 11/23/18   Bobbitt, Heywood Ilesalph Carter, MD  albuterol (PROVENTIL) (2.5 MG/3ML) 0.083% nebulizer solution Take 3 mLs (2.5 mg total) by nebulization every 4 (four) hours as needed for wheezing or shortness of breath. 11/17/17   Bobbitt, Heywood Ilesalph Carter, MD  Crisaborole (EUCRISA) 2 % OINT Apply 1 application topically 2 (two) times daily as needed (to red itchy areas). 11/23/18   Bobbitt, Sedalia Muta, MD  EPINEPHrine (EPIPEN 2-PAK) 0.3 mg/0.3 mL IJ SOAJ injection Use as directed for severe allergic reaction. 11/23/18   Bobbitt, Sedalia Muta, MD  famotidine (PEPCID) 20 MG tablet Take 1 tablet (20 mg total) by mouth 2 (two) times daily. 09/16/19   Lajean Manes, CNM  fluticasone Asencion Islam) 50 MCG/ACT nasal spray 2 sprays per nostril once a day for stuffy nose Patient not taking: Reported on 11/23/2018 11/17/17   Bobbitt, Sedalia Muta, MD  ipratropium (ATROVENT) 0.06 % nasal spray Two sprays each nostril three times a day as needed for runny nose. Patient not taking: Reported on 11/23/2018 11/17/17   Bobbitt, Sedalia Muta, MD  loratadine (CLARITIN) 10 MG tablet Take 1 tablet (10 mg total) by mouth daily as needed for allergies, rhinitis or  itching. 11/17/17   Bobbitt, Sedalia Muta, MD  magnesium 30 MG tablet Take 1 tablet (30 mg total) by mouth 2 (two) times daily. 09/19/19   Tedd Sias, PA  Olopatadine HCl (PAZEO) 0.7 % SOLN Place 1 drop into both eyes 1 day or 1 dose. 11/17/17   Bobbitt, Sedalia Muta, MD  ondansetron (ZOFRAN ODT) 4 MG disintegrating tablet Take 1 tablet (4 mg total) by mouth every 8 (eight) hours as needed for nausea or vomiting. 09/16/19   Lajean Manes, CNM  pimecrolimus (ELIDEL) 1 % cream Apply topically 2 (two) times daily. Patient not taking: Reported on 11/17/2017 09/01/15   Leda Roys, MD  potassium chloride SA (KLOR-CON) 20 MEQ tablet Take 1 tablet (20 mEq total) by mouth 2 (two) times daily for 5 days. 09/17/19 09/22/19  Truddie Hidden, MD  prochlorperazine (COMPAZINE) 10 MG tablet Take 1 tablet (10 mg total) by mouth 2 (two) times daily as needed for nausea or vomiting. 09/17/19   Truddie Hidden, MD  promethazine (PHENERGAN) 25 MG tablet Take 1 tablet (25 mg total) by mouth every 6 (six) hours as needed for nausea or vomiting. 09/19/19   Tedd Sias, PA  triamcinolone cream (KENALOG) 0.1 % Apply twice a day to red itchy areas below the face. 11/17/17   Bobbitt, Sedalia Muta, MD  vitamin B-6 (PYRIDOXINE) 25 MG tablet Take 1 tablet (25 mg total) by mouth in the morning and at bedtime. 09/19/19   Tedd Sias, PA    Allergies    Cantaloupe extract allergy skin test, Citrullus vulgaris, Peanut-containing drug products, Sesame seed (diagnostic), Watermelon flavor, and Other  Review of Systems   Review of Systems  Constitutional: Negative for chills and fever.  HENT: Negative for congestion.   Eyes: Negative for pain.  Respiratory: Negative for cough and shortness of breath.   Cardiovascular: Negative for chest pain and leg swelling.  Gastrointestinal: Positive for nausea and vomiting. Negative for abdominal pain.  Genitourinary: Negative for dysuria.  Musculoskeletal: Negative for myalgias.    Skin: Negative for rash.  Neurological: Negative for dizziness and headaches.    Physical Exam Updated Vital Signs BP 119/77 (BP Location: Right Arm)    Pulse (!) 58    Temp 98.7 F (37.1 C) (Oral)    Resp 16    LMP 08/07/2019    SpO2 98%   Physical Exam Vitals and nursing note reviewed.  Constitutional:      General: She is not in acute distress. HENT:     Head: Normocephalic and atraumatic.  Nose: Nose normal.     Mouth/Throat:     Mouth: Mucous membranes are dry.  Eyes:     General: No scleral icterus. Cardiovascular:     Rate and Rhythm: Normal rate and regular rhythm.     Pulses: Normal pulses.     Heart sounds: Normal heart sounds.  Pulmonary:     Effort: Pulmonary effort is normal. No respiratory distress.     Breath sounds: No wheezing.  Abdominal:     Palpations: Abdomen is soft.     Tenderness: There is no abdominal tenderness. There is no right CVA tenderness, left CVA tenderness, guarding or rebound.  Musculoskeletal:     Cervical back: Normal range of motion.     Right lower leg: No edema.     Left lower leg: No edema.  Skin:    General: Skin is warm and dry.     Capillary Refill: Capillary refill takes less than 2 seconds.  Neurological:     Mental Status: She is alert. Mental status is at baseline.  Psychiatric:        Mood and Affect: Mood normal.        Behavior: Behavior normal.     ED Results / Procedures / Treatments   Labs (all labs ordered are listed, but only abnormal results are displayed) Labs Reviewed  CBC WITH DIFFERENTIAL/PLATELET - Abnormal; Notable for the following components:      Result Value   WBC 17.8 (*)    Neutro Abs 13.8 (*)    Abs Immature Granulocytes 0.10 (*)    All other components within normal limits  COMPREHENSIVE METABOLIC PANEL - Abnormal; Notable for the following components:   Sodium 132 (*)    Potassium 2.6 (*)    Chloride 97 (*)    Total Protein 8.5 (*)    Albumin 5.2 (*)    All other components  within normal limits  MAGNESIUM - Abnormal; Notable for the following components:   Magnesium 1.6 (*)    All other components within normal limits  LIPASE, BLOOD    EKG EKG Interpretation  Date/Time:  Wednesday September 19 2019 15:20:29 EDT Ventricular Rate:  56 PR Interval:    QRS Duration: 87 QT Interval:  441 QTC Calculation: 426 R Axis:   70 Text Interpretation: Sinus rhythm Consider left atrial enlargement Confirmed by Geoffery Lyons ([id number]) on 09/19/2019 3:23:19 PM   Radiology No results found.  Procedures .Critical Care Performed by: Gailen Shelter, PA Authorized by: Gailen Shelter, PA   Critical care provider statement:    Critical care time (minutes):  35   Critical care time was exclusive of:  Separately billable procedures and treating other patients and teaching time   Critical care was necessary to treat or prevent imminent or life-threatening deterioration of the following conditions: Severe hypokalemia, nausea, vomiting.   Critical care was time spent personally by me on the following activities:  Discussions with consultants, evaluation of patient's response to treatment, examination of patient, review of old charts, re-evaluation of patient's condition, pulse oximetry, ordering and review of radiographic studies, ordering and review of laboratory studies and ordering and performing treatments and interventions   I assumed direction of critical care for this patient from another provider in my specialty: no     (including critical care time)  Medications Ordered in ED Medications  acetaminophen (TYLENOL) tablet 650 mg (0 mg Oral Hold 09/19/19 1441)  potassium chloride 10 mEq in 100 mL IVPB ( Intravenous Stopped  09/19/19 1651)  promethazine (PHENERGAN) injection 12.5 mg (12.5 mg Intravenous Given 09/19/19 1438)  lactated ringers bolus 1,000 mL ( Intravenous Stopped 09/19/19 1541)  potassium chloride SA (KLOR-CON) CR tablet 40 mEq (40 mEq Oral Given 09/19/19 1515)    ED  Course  I have reviewed the triage vital signs and the nursing notes.  Pertinent labs & imaging results that were available during my care of the patient were reviewed by me and considered in my medical decision making (see chart for details).  Patient is 24 year old female who is approximately [redacted] weeks pregnant presented today for continued nausea and vomiting.  She has been seen and assessed at [facility] and was found to have an intra-abdominal pregnancy/intrauterine pregnancy.  She has follow-up appointment scheduled already.  She was told to follow with [facility] for any future needs.  She states she came to the emergency department at [facility] today because of her nausea and has no other concerns.  Given she was hypokalemic during her prior evaluation will obtain CMP, CBC, lipase, mag.  I discussed case with my attending physician Dr. Judd Lien who is agreeable to plan.  I will also provide patient with 1 L LR and Phenergan IV.  Clinical Course as of Sep 18 1709  Wed Sep 19, 2019  1524 CBC with leukocytosis which is nonspecific likely secondary to her severe vomiting.  Doubt infectious process she has no urinary complaints no abdominal pain, fevers, chills, chest pain, shortness of breath, cough.  Patient also has CMP notable for severe hypokalemia 2.6 and a moderately low sodium 132.  This is likely secondary to her vomiting.  She also has low chloride.  Will replete with 2 L of crystalloid.  Lipase within normal limits doubt pancreatitis.   [WF]  1526 EKG normal sinus rhythm with no evidence of ischemia.  No U waves.  No evidence of arrhythmia however significant QT abnormality.   [WF]  1526 Discussed my attending physician Dr. Judd Lien who recommended IV potassium repletion X2, p.o. potassium, fluid challenge and discharge home if able to tolerate p.o.  My plan is to discharge with Phenergan which she may use as a suppository, intravaginal or p.o. depending on her ability to tolerate p.o.   [WF]  1527  Patient is currently tolerating p.o. and states that she is no longer feeling nauseous.   [WF]    Clinical Course User Index [WF] Gailen Shelter, [place]   Patient feels significantly better.  Is tolerating p.o. without difficulty.  Plan is to discharge with close [facility] follow-up.  She was given Phenergan which she may use as a suppository, intravaginally or p.o.  She is also discharged with magnesium, she already has 5 potassium supplements which she will take daily for the next 5 days.  Also recommended ginger and provided patient with prescription for vitamin B6 for nausea.  Given patient has confirmed intrauterine pregnancy doubt ectopic.  She has no vaginal bleeding or pelvic pain or abdominal pain.  Doubt appendicitis, doubt intra-abdominal abscess or other acute abnormality.  Suspect that this is her symptoms related to pregnancy.  MDM Rules/Calculators/A&P                      The medical records were personally reviewed by myself. I personally reviewed all lab results and interpreted all imaging studies and either concurred with their official read or contacted radiology for clarification. Additional history obtained from old records/EMS/family members.  This patient appears reasonably screened and I  doubt any other medical condition requiring further workup, evaluation, or treatment in the ED at this time prior to discharge.   Patient's vitals are WNL apart from vital sign abnormalities discussed above, patient is in NAD, and able to ambulate in the ED at their baseline and able to tolerate PO.  Pain has been managed or a plan has been made for home management and has no complaints prior to discharge. Patient is comfortable with above plan and for discharge at this time. All questions were answered prior to disposition. Results from the ER workup discussed with the patient face to face and all questions answered to the best of my ability. The patient is safe for discharge with strict  return precautions. Patient appears safe for discharge with appropriate follow-up. Conveyed my impression with the patient and they voiced understanding and are agreeable to plan.   An After Visit Summary was printed and given to the patient.  Portions of this note were generated with Scientist, clinical (histocompatibility and immunogenetics). Dictation errors may occur despite best attempts at proofreading.   Final Clinical Impression(s) / ED Diagnoses Final diagnoses:  Non-intractable vomiting with nausea, unspecified vomiting type  Less than [redacted] weeks gestation of pregnancy  Dehydration  Hypokalemia  Hypomagnesemia    Rx / DC Orders ED Discharge Orders         Ordered    magnesium 30 MG tablet  2 times daily     09/19/19 1706    promethazine (PHENERGAN) 25 MG tablet  Every 6 hours PRN     09/19/19 1706    vitamin B-6 (PYRIDOXINE) 25 MG tablet  2 times daily     09/19/19 1707           Solon Augusta [place], [place] 09/19/19 1713    Geoffery Lyons, MD 09/20/19 2305

## 2019-10-01 ENCOUNTER — Ambulatory Visit: Admission: RE | Admit: 2019-10-01 | Payer: Medicaid Other | Source: Ambulatory Visit

## 2019-11-28 ENCOUNTER — Other Ambulatory Visit: Payer: Self-pay | Admitting: Allergy and Immunology

## 2019-11-28 ENCOUNTER — Encounter: Payer: Self-pay | Admitting: Allergy and Immunology

## 2019-11-28 ENCOUNTER — Ambulatory Visit (INDEPENDENT_AMBULATORY_CARE_PROVIDER_SITE_OTHER): Payer: Medicaid Other | Admitting: Allergy and Immunology

## 2019-11-28 VITALS — BP 102/62 | HR 88 | Temp 98.5°F | Resp 18 | Ht 69.0 in | Wt 149.2 lb

## 2019-11-28 DIAGNOSIS — J3089 Other allergic rhinitis: Secondary | ICD-10-CM | POA: Diagnosis not present

## 2019-11-28 DIAGNOSIS — T7800XA Anaphylactic reaction due to unspecified food, initial encounter: Secondary | ICD-10-CM

## 2019-11-28 DIAGNOSIS — T7800XD Anaphylactic reaction due to unspecified food, subsequent encounter: Secondary | ICD-10-CM | POA: Diagnosis not present

## 2019-11-28 DIAGNOSIS — L2089 Other atopic dermatitis: Secondary | ICD-10-CM

## 2019-11-28 DIAGNOSIS — J4520 Mild intermittent asthma, uncomplicated: Secondary | ICD-10-CM | POA: Diagnosis not present

## 2019-11-28 DIAGNOSIS — Z3A16 16 weeks gestation of pregnancy: Secondary | ICD-10-CM

## 2019-11-28 DIAGNOSIS — Z3490 Encounter for supervision of normal pregnancy, unspecified, unspecified trimester: Secondary | ICD-10-CM | POA: Insufficient documentation

## 2019-11-28 DIAGNOSIS — Z87891 Personal history of nicotine dependence: Secondary | ICD-10-CM

## 2019-11-28 MED ORDER — BUDESONIDE 32 MCG/ACT NA SUSP: NASAL | 5 refills | Status: AC

## 2019-11-28 MED ORDER — PULMICORT FLEXHALER 180 MCG/ACT IN AEPB: INHALATION_SPRAY | RESPIRATORY_TRACT | 5 refills | Status: AC

## 2019-11-28 MED ORDER — EUCRISA 2 % EX OINT: TOPICAL_OINTMENT | CUTANEOUS | 3 refills | Status: AC

## 2019-11-28 MED ORDER — EPINEPHRINE 0.3 MG/0.3ML IJ SOAJ: INTRAMUSCULAR | 1 refills | Status: AC

## 2019-11-28 MED ORDER — BUDESONIDE 0.5 MG/2ML IN SUSP: 0.5000 mg | Freq: Two times a day (BID) | RESPIRATORY_TRACT | 5 refills | Status: AC

## 2019-11-28 MED ORDER — ALBUTEROL SULFATE HFA 108 (90 BASE) MCG/ACT IN AERS: INHALATION_SPRAY | RESPIRATORY_TRACT | 1 refills | Status: DC

## 2019-11-28 NOTE — Assessment & Plan Note (Signed)
Currently well controlled.  Continue albuterol HFA, 1 to 2 inhalations every 4-6 hours if needed.  During upper respiratory tract infections or asthma flares, add Pulmicort 180 g, 2 inhalations twice daily until symptoms have returned to baseline.  A prescription has been provided for Pulmicort 180 g inhaler.  I have discussed with the patient the importance of strict during pregnancy.  The patient has been asked to contact my office should her asthma symptoms increase.  Otherwise, she may follow-up in 2 months.

## 2019-11-28 NOTE — Assessment & Plan Note (Signed)
   Continue meticulous avoidance of peanuts, tree nuts, sesame seed, watermelon, and cantaloupe and have access to epinephrine autoinjector 2 pack in case of accidental ingestion.  Food allergy action plan is in place.  A refill prescription has been provided for epinephrine 0.3 mg autoinjector 2 pack along with instructions for its proper administration.

## 2019-11-28 NOTE — Telephone Encounter (Signed)
Is budesonide 0.5 mg (Pulmicort) twice daily via nebulizer covered by insurance?  If so, because the patient is pregnant, let us go with the budesonide. Thanks.

## 2019-11-28 NOTE — Telephone Encounter (Signed)
Insurance does not cover the pulmicort please advise to change to flovent 220

## 2019-11-28 NOTE — Progress Notes (Signed)
Follow-up Note  RE: Madison French MRN: [id number] DOB: Oct 11, 1995 Date of Office Visit: 11/28/2019  Primary care provider: [facility] Referring provider: [facility]*  History of present illness: Madison French is a 24 y.o. female with asthma, allergic rhinitis, eczema, and food allergies presenting today for follow-up.  She was last seen in this clinic in August 2020.  She is currently [redacted] weeks pregnant.  She reports that her asthma has been well controlled.  She over the past several months she has required albuterol rescue less than 1-2 times per month and has not been experiencing limitations in normal daily activities or nocturnal awakenings due to lower respiratory symptoms.  Her albuterol HFA has expired and she needs a refill prescription.  She reports that her nasal allergy symptoms have been well controlled with fluticasone nasal spray as needed.  She does not report having used cetirizine in the interval since her previous visit.  Regarding atopic dermatitis, she needs a refill prescription for Eucrisa.  The atopic dermatitis usually affects her hands, antecubital fossae, and/or popliteal fossae. Madison French has successfully avoided peanuts, tree nuts, sesame seed, watermelon, and cantaloupe in the interval since her previous visit without accidental ingestion.  Assessment and plan: Mild intermittent asthma Currently well controlled.  Continue albuterol HFA, 1 to 2 inhalations every 4-6 hours if needed.  During upper respiratory tract infections or asthma flares, add Pulmicort 180 g, 2 inhalations twice daily until symptoms have returned to baseline.  A prescription has been provided for Pulmicort 180 g inhaler.  I have discussed with the patient the importance of strict during pregnancy.  The patient has been asked to contact my office should her asthma symptoms increase.  Otherwise, she may follow-up in 2 months.  Other allergic  rhinitis  Continue appropriate allergen avoidance measures.  During pregnancy, I have recommended over-the-counter Rhinocort AQ, 1 spray per nostril twice daily if needed.  Nasal saline spray (i.e., Simply Saline) or nasal saline lavage (i.e., NeilMed) is recommended as needed and prior to medicated nasal sprays.  Flexural atopic dermatitis  Continue appropriate skin care measures.  A refill prescription has been provided for Eucrisa (crisaborole) 2% ointment twice a day to affected areas as needed.  Food allergy  Continue meticulous avoidance of peanuts, tree nuts, sesame seed, watermelon, and cantaloupe and have access to epinephrine autoinjector 2 pack in case of accidental ingestion.  Food allergy action plan is in place.  A refill prescription has been provided for epinephrine 0.3 mg autoinjector 2 pack along with instructions for its proper administration.  History of tobacco use  The patient has been encouraged to maintain her abstinence from smoking, particularly while while pregnant.   Meds ordered this encounter  Medications  . EPINEPHrine (EPIPEN 2-PAK) 0.3 mg/0.3 mL IJ SOAJ injection    Sig: Use as directed for severe allergic reaction.    Dispense:  2 each    Refill:  1  . albuterol (PROVENTIL HFA) 108 (90 Base) MCG/ACT inhaler    Sig: 2 puffs every 4 hours if needed for wheezing or coughing spells    Dispense:  18 g    Refill:  1  . budesonide (PULMICORT FLEXHALER) 180 MCG/ACT inhaler    Sig: 2 puffs twice daily to prevent coughing or wheezing    Dispense:  1 each    Refill:  5  . budesonide (RHINOCORT AQUA) 32 MCG/ACT nasal spray    Sig: 1 spray twice daily as needed for stuffy nose.  Dispense:  8.6 g    Refill:  5  . Crisaborole (EUCRISA) 2 % OINT    Sig: Apply  Twice daily to affected areas as needed.    Dispense:  60 g    Refill:  3    Diagnostics: Spirometry:  Normal with an FEV1 of 107% predicted. This study was performed while the patient was  asymptomatic.  Please see scanned spirometry results for details.    Physical examination: Blood pressure 102/62, pulse 88, temperature 98.5 F (36.9 C), temperature source Oral, resp. rate 18, height 5\' 9"  (1.753 m), weight 149 lb 3.2 oz (67.7 kg), last menstrual period 08/07/2019, SpO2 100 %.  General: Alert, interactive, in no acute distress. HEENT: TMs pearly gray, turbinates mildly edematous without discharge, post-pharynx mildly erythematous. Neck: Supple without lymphadenopathy. Lungs: Clear to auscultation without wheezing, rhonchi or rales. CV: Normal S1, S2 without murmurs. Skin: Warm and dry, without lesions or rashes.  The following portions of the patient's history were reviewed and updated as appropriate: allergies, current medications, past family history, past medical history, past social history, past surgical history and problem list.  Current Outpatient Medications  Medication Sig Dispense Refill  . albuterol (PROVENTIL HFA) 108 (90 Base) MCG/ACT inhaler 2 puffs every 4 hours if needed for wheezing or coughing spells 18 g 1  . albuterol (PROVENTIL) (2.5 MG/3ML) 0.083% nebulizer solution Take 3 mLs (2.5 mg total) by nebulization every 4 (four) hours as needed for wheezing or shortness of breath. 75 mL 1  . EPINEPHrine (EPIPEN 2-PAK) 0.3 mg/0.3 mL IJ SOAJ injection Use as directed for severe allergic reaction. 2 each 1  . fluticasone (FLONASE) 50 MCG/ACT nasal spray 2 sprays per nostril once a day for stuffy nose 16 g 5  . loratadine (CLARITIN) 10 MG tablet Take 1 tablet (10 mg total) by mouth daily as needed for allergies, rhinitis or itching. 30 tablet 5  . Olopatadine HCl (PAZEO) 0.7 % SOLN Place 1 drop into both eyes 1 day or 1 dose. 1 Bottle 5  . ondansetron (ZOFRAN ODT) 4 MG disintegrating tablet Take 1 tablet (4 mg total) by mouth every 8 (eight) hours as needed for nausea or vomiting. 30 tablet 2  . prochlorperazine (COMPAZINE) 10 MG tablet Take 1 tablet (10 mg  total) by mouth 2 (two) times daily as needed for nausea or vomiting. 10 tablet 0  . promethazine (PHENERGAN) 25 MG tablet Take 1 tablet (25 mg total) by mouth every 6 (six) hours as needed for nausea or vomiting. 30 tablet 0  . budesonide (PULMICORT FLEXHALER) 180 MCG/ACT inhaler 2 puffs twice daily to prevent coughing or wheezing 1 each 5  . budesonide (RHINOCORT AQUA) 32 MCG/ACT nasal spray 1 spray twice daily as needed for stuffy nose. 8.6 g 5  . Crisaborole (EUCRISA) 2 % OINT Apply 1 application topically 2 (two) times daily as needed (to red itchy areas). (Patient not taking: Reported on 11/28/2019) 100 g 5  . Crisaborole (EUCRISA) 2 % OINT Apply  Twice daily to affected areas as needed. 60 g 3  . famotidine (PEPCID) 20 MG tablet Take 1 tablet (20 mg total) by mouth 2 (two) times daily. (Patient not taking: Reported on 11/28/2019) 30 tablet 2  . ipratropium (ATROVENT) 0.06 % nasal spray Two sprays each nostril three times a day as needed for runny nose. (Patient not taking: Reported on 11/23/2018) 15 mL 5  . potassium chloride SA (KLOR-CON) 20 MEQ tablet Take 1 tablet (20 mEq total) by  mouth 2 (two) times daily for 5 days. 10 tablet 0   No current facility-administered medications for this visit.    Allergies  Allergen Reactions  . Cantaloupe Extract Allergy Skin Test Anaphylaxis  . Citrullus Vulgaris Other (See Comments)    Unknown  Unknown   . Peanut-Containing Drug Products     All tree nuts  . Sesame Seed (Diagnostic)   . Watermelon Flavor   . Other Rash    All tree nuts   Review of systems: Review of systems negative except as noted in HPI / PMHx.  Past Medical History:  Diagnosis Date  . Allergic rhinitis   . Asthma   . Eczema   . Food allergy     Family History  Problem Relation Age of Onset  . Allergic rhinitis Mother   . Asthma Mother   . Multiple sclerosis Mother   . Angioedema Neg Hx   . Eczema Neg Hx   . Immunodeficiency Neg Hx   . Urticaria Neg Hx      Social History   Socioeconomic History  . Marital status: Single    Spouse name: Not on file  . Number of children: Not on file  . Years of education: Not on file  . Highest education level: Not on file  Occupational History  . Not on file  Tobacco Use  . Smoking status: Former Smoker    Packs/day: 0.25    Years: 3.00    Pack years: 0.75    Types: Cigarettes  . Smokeless tobacco: Never Used  Vaping Use  . Vaping Use: Never used  Substance and Sexual Activity  . Alcohol use: No    Alcohol/week: 0.0 standard drinks  . Drug use: Yes    Types: Marijuana    Comment: last smoked "couple days"  . Sexual activity: Not on file  Other Topics Concern  . Not on file  Social History Narrative  . Not on file   Social Determinants of Health   Financial Resource Strain:   . Difficulty of Paying Living Expenses:   Food Insecurity:   . Worried About Programme researcher, broadcasting/film/video in the Last Year:   . Barista in the Last Year:   Transportation Needs:   . Freight forwarder (Medical):   Marland Kitchen Lack of Transportation (Non-Medical):   Physical Activity:   . Days of Exercise per Week:   . Minutes of Exercise per Session:   Stress:   . Feeling of Stress :   Social Connections:   . Frequency of Communication with Friends and Family:   . Frequency of Social Gatherings with Friends and Family:   . Attends Religious Services:   . Active Member of Clubs or Organizations:   . Attends Banker Meetings:   Marland Kitchen Marital Status:   Intimate Partner Violence:   . Fear of Current or Ex-Partner:   . Emotionally Abused:   Marland Kitchen Physically Abused:   . Sexually Abused:     I appreciate the opportunity to take part in Madison French's care. Please do not hesitate to contact me with questions.  Sincerely,   R. Jorene Guest, MD

## 2019-11-28 NOTE — Patient Instructions (Addendum)
Mild intermittent asthma Currently well controlled.  Continue albuterol HFA, 1 to 2 inhalations every 4-6 hours if needed.  During upper respiratory tract infections or asthma flares, add Pulmicort 180 g, 2 inhalations twice daily until symptoms have returned to baseline.  A prescription has been provided for Pulmicort 180 g inhaler.  I have discussed with the patient the importance of strict during pregnancy.  The patient has been asked to contact my office should her asthma symptoms increase.  Otherwise, she may follow-up in 2 months.  Other allergic rhinitis  Continue appropriate allergen avoidance measures.  During pregnancy, I have recommended over-the-counter Rhinocort AQ, 1 spray per nostril twice daily if needed.  Nasal saline spray (i.e., Simply Saline) or nasal saline lavage (i.e., NeilMed) is recommended as needed and prior to medicated nasal sprays.  Flexural atopic dermatitis  Continue appropriate skin care measures.  A refill prescription has been provided for Eucrisa (crisaborole) 2% ointment twice a day to affected areas as needed.  Food allergy  Continue meticulous avoidance of peanuts, tree nuts, sesame seed, watermelon, and cantaloupe and have access to epinephrine autoinjector 2 pack in case of accidental ingestion.  Food allergy action plan is in place.  A refill prescription has been provided for epinephrine 0.3 mg autoinjector 2 pack along with instructions for its proper administration.  History of tobacco use  The patient has been encouraged to maintain her abstinence from smoking, particularly while while pregnant.   Return in about 2 months (around 01/28/2020), or if symptoms worsen or fail to improve.

## 2019-11-28 NOTE — Assessment & Plan Note (Addendum)
   The patient has been encouraged to maintain her abstinence from smoking, particularly while while pregnant.

## 2019-11-28 NOTE — Assessment & Plan Note (Signed)
   Continue appropriate allergen avoidance measures.  During pregnancy, I have recommended over-the-counter Rhinocort AQ, 1 spray per nostril twice daily if needed.  Nasal saline spray (i.e., Simply Saline) or nasal saline lavage (i.e., NeilMed) is recommended as needed and prior to medicated nasal sprays.

## 2019-11-28 NOTE — Assessment & Plan Note (Signed)
   Continue appropriate skin care measures.  A refill prescription has been provided for Eucrisa (crisaborole) 2% ointment twice a day to affected areas as needed.

## 2019-11-29 ENCOUNTER — Other Ambulatory Visit: Payer: Self-pay | Admitting: Allergy and Immunology

## 2019-12-06 ENCOUNTER — Telehealth: Payer: Self-pay

## 2019-12-06 NOTE — Telephone Encounter (Signed)
Pa submitted thru fax to [facility] for eucrisa

## 2020-01-30 ENCOUNTER — Ambulatory Visit: Payer: Medicaid Other | Admitting: Allergy and Immunology

## 2020-09-11 ENCOUNTER — Other Ambulatory Visit: Payer: Self-pay

## 2020-09-11 ENCOUNTER — Emergency Department (HOSPITAL_BASED_OUTPATIENT_CLINIC_OR_DEPARTMENT_OTHER)
Admission: EM | Admit: 2020-09-11 | Discharge: 2020-09-11 | Disposition: A | Discharge status: Home / Self Care | Payer: Medicaid Other | Attending: Emergency Medicine | Admitting: Emergency Medicine

## 2020-09-11 ENCOUNTER — Encounter (HOSPITAL_BASED_OUTPATIENT_CLINIC_OR_DEPARTMENT_OTHER): Payer: Self-pay | Admitting: Emergency Medicine

## 2020-09-11 DIAGNOSIS — J4530 Mild persistent asthma, uncomplicated: Secondary | ICD-10-CM | POA: Diagnosis not present

## 2020-09-11 DIAGNOSIS — E860 Dehydration: Secondary | ICD-10-CM | POA: Insufficient documentation

## 2020-09-11 DIAGNOSIS — Z87891 Personal history of nicotine dependence: Secondary | ICD-10-CM | POA: Diagnosis not present

## 2020-09-11 DIAGNOSIS — R112 Nausea with vomiting, unspecified: Secondary | ICD-10-CM | POA: Diagnosis not present

## 2020-09-11 DIAGNOSIS — Z91010 Allergy to peanuts: Secondary | ICD-10-CM | POA: Insufficient documentation

## 2020-09-11 DIAGNOSIS — R109 Unspecified abdominal pain: Secondary | ICD-10-CM | POA: Insufficient documentation

## 2020-09-11 DIAGNOSIS — Z7951 Long term (current) use of inhaled steroids: Secondary | ICD-10-CM | POA: Diagnosis not present

## 2020-09-11 DIAGNOSIS — Z789 Other specified health status: Secondary | ICD-10-CM

## 2020-09-11 DIAGNOSIS — F1099 Alcohol use, unspecified with unspecified alcohol-induced disorder: Secondary | ICD-10-CM | POA: Insufficient documentation

## 2020-09-11 DIAGNOSIS — Z7289 Other problems related to lifestyle: Secondary | ICD-10-CM

## 2020-09-11 LAB — COMPREHENSIVE METABOLIC PANEL
ALT: 29 U/L (ref 0–44)
AST: 38 U/L (ref 15–41)
Albumin: 4.6 g/dL (ref 3.5–5.0)
Alkaline Phosphatase: 70 U/L (ref 38–126)
Anion gap: 15 (ref 5–15)
BUN: 13 mg/dL (ref 6–20)
CO2: 22 mmol/L (ref 22–32)
Calcium: 9.6 mg/dL (ref 8.9–10.3)
Chloride: 101 mmol/L (ref 98–111)
Creatinine, Ser: 0.78 mg/dL (ref 0.44–1.00)
GFR, Estimated: >60 mL/min (ref >60)
Glucose, Bld: 110 mg/dL — ABNORMAL HIGH (ref 70–99)
Potassium: 4.4 mmol/L (ref 3.5–5.1)
Sodium: 138 mmol/L (ref 135–145)
Total Bilirubin: 1.1 mg/dL (ref 0.3–1.2)
Total Protein: 8.6 g/dL — ABNORMAL HIGH (ref 6.5–8.1)

## 2020-09-11 LAB — CBC WITH DIFFERENTIAL/PLATELET
Abs Immature Granulocytes: 0.13 10*3/uL — ABNORMAL HIGH (ref 0.00–0.07)
Basophils Absolute: 0 10*3/uL (ref 0.0–0.1)
Basophils Relative: 0 %
Eosinophils Absolute: 0 10*3/uL (ref 0.0–0.5)
Eosinophils Relative: 0 %
HCT: 40.3 % (ref 36.0–46.0)
Hemoglobin: 14 g/dL (ref 12.0–15.0)
Immature Granulocytes: 1 %
Lymphocytes Relative: 5 %
Lymphs Abs: 0.9 10*3/uL (ref 0.7–4.0)
MCH: 32.5 pg (ref 26.0–34.0)
MCHC: 34.7 g/dL (ref 30.0–36.0)
MCV: 93.5 fL (ref 80.0–100.0)
Monocytes Absolute: 0.4 10*3/uL (ref 0.1–1.0)
Monocytes Relative: 2 %
Neutro Abs: 18.1 10*3/uL — ABNORMAL HIGH (ref 1.7–7.7)
Neutrophils Relative %: 92 %
Platelets: 423 10*3/uL — ABNORMAL HIGH (ref 150–400)
RBC: 4.31 MIL/uL (ref 3.87–5.11)
RDW: 12.7 % (ref 11.5–15.5)
WBC: 19.6 10*3/uL — ABNORMAL HIGH (ref 4.0–10.5)
nRBC: 0 % (ref 0.0–0.2)

## 2020-09-11 LAB — LIPASE, BLOOD: Lipase: 28 U/L (ref 11–51)

## 2020-09-11 LAB — PREGNANCY, URINE: Preg Test, Ur: NEGATIVE

## 2020-09-11 MED ORDER — SODIUM CHLORIDE 0.9 % IV BOLUS
1000.0000 mL | Freq: Once | INTRAVENOUS | Status: AC
  Administered 2020-09-11: 1000 mL via INTRAVENOUS

## 2020-09-11 MED ORDER — ONDANSETRON HCL 4 MG/2ML IJ SOLN
4.0000 mg | Freq: Once | INTRAMUSCULAR | Status: AC
  Administered 2020-09-11: 4 mg via INTRAVENOUS
  Filled 2020-09-11: qty 2

## 2020-09-11 MED ORDER — ONDANSETRON 4 MG PO TBDP
4.0000 mg | ORAL_TABLET | Freq: Once | ORAL | Status: AC
  Administered 2020-09-11: 4 mg via ORAL
  Filled 2020-09-11: qty 1

## 2020-09-11 MED ORDER — ONDANSETRON 4 MG PO TBDP: 4.0000 mg | ORAL_TABLET | Freq: Three times a day (TID) | ORAL | 0 refills | Status: AC | PRN

## 2020-09-11 NOTE — ED Provider Notes (Signed)
[facility] EMERGENCY DEPARTMENT Provider Note   CSN: [id number] Arrival date & time: 09/11/20  1811     History Chief Complaint  Patient presents with  . Abdominal Pain    ETOH and Cannabis use    Madison French is a 25 y.o. female.  The history is provided by the patient and medical records. No language interpreter was used.  Emesis Severity:  Moderate Duration:  1 day Timing:  Constant Quality:  Stomach contents Progression:  Unchanged Chronicity:  New Relieved by:  Nothing Worsened by:  Nothing Ineffective treatments:  None tried Associated symptoms: abdominal pain (mild)   Associated symptoms: no chills, no cough, no diarrhea, no fever, no headaches and no URI   Risk factors: alcohol use        Past Medical History:  Diagnosis Date  . Allergic rhinitis   . Asthma   . Eczema   . Food allergy     Patient Active Problem List   Diagnosis Date Noted  . Pregnancy 11/28/2019  . Hypotension 11/23/2018  . History of tobacco use 11/23/2018  . Mild persistent asthma without complication 08/31/2016  . Seasonal allergic conjunctivitis 08/31/2016  . Seasonal allergic rhinitis due to pollen 08/31/2016  . Mild intermittent asthma 09/01/2015  . Other allergic rhinitis 09/01/2015  . Food allergy 09/01/2015  . Flexural atopic dermatitis 09/01/2015  . Breast mass, right 06/02/2015  . Chronic nonintractable headache 06/02/2015  . Flu vaccine need 06/02/2015  . Keloid of skin 06/02/2015  . Vitamin D deficiency 06/02/2015    Past Surgical History:  Procedure Laterality Date  . CESAREAN SECTION    . TYMPANOSTOMY TUBE PLACEMENT       OB History    Gravida  2   Para  1   Term      Preterm  1   AB      Living  2     SAB      IAB      Ectopic      Multiple  1   Live Births              Family History  Problem Relation Age of Onset  . Allergic rhinitis Mother   . Asthma Mother   . Multiple sclerosis Mother   . Angioedema Neg Hx    . Eczema Neg Hx   . Immunodeficiency Neg Hx   . Urticaria Neg Hx     Social History   Tobacco Use  . Smoking status: Former Smoker    Packs/day: 0.25    Years: 3.00    Pack years: 0.75    Types: Cigarettes  . Smokeless tobacco: Never Used  Vaping Use  . Vaping Use: Never used  Substance Use Topics  . Alcohol use: No    Alcohol/week: 0.0 standard drinks  . Drug use: Yes    Types: Marijuana    Comment: last smoked "couple days"    Home Medications Prior to Admission medications   Medication Sig Start Date End Date Taking? Authorizing Provider  albuterol (PROVENTIL HFA) 108 (90 Base) MCG/ACT inhaler 2 puffs every 4 hours if needed for wheezing or coughing spells 11/28/19   Bobbitt, Heywood Iles, MD  albuterol (PROVENTIL) (2.5 MG/3ML) 0.083% nebulizer solution Take 3 mLs (2.5 mg total) by nebulization every 4 (four) hours as needed for wheezing or shortness of breath. 11/17/17   Bobbitt, Heywood Iles, MD  budesonide (PULMICORT FLEXHALER) 180 MCG/ACT inhaler 2 puffs twice daily to prevent coughing or  wheezing 11/28/19   Bobbitt, Heywood Ilesalph Carter, MD  budesonide (PULMICORT) 0.5 MG/2ML nebulizer solution Take 2 mLs (0.5 mg total) by nebulization 2 (two) times daily. 11/28/19   Bobbitt, Heywood Ilesalph Carter, MD  budesonide (RHINOCORT AQUA) 32 MCG/ACT nasal spray 1 spray twice daily as needed for stuffy nose. 11/28/19   Bobbitt, Heywood Ilesalph Carter, MD  Crisaborole (EUCRISA) 2 % OINT Apply 1 application topically 2 (two) times daily as needed (to red itchy areas). Patient not taking: Reported on 11/28/2019 11/23/18   Bobbitt, Heywood Ilesalph Carter, MD  Crisaborole (EUCRISA) 2 % OINT Apply  Twice daily to affected areas as needed. 11/28/19   Bobbitt, Heywood Ilesalph Carter, MD  EPINEPHrine (EPIPEN 2-PAK) 0.3 mg/0.3 mL IJ SOAJ injection Use as directed for severe allergic reaction. 11/28/19   Bobbitt, Heywood Ilesalph Carter, MD  famotidine (PEPCID) 20 MG tablet Take 1 tablet (20 mg total) by mouth 2 (two) times daily. Patient not taking:  Reported on 11/28/2019 09/16/19   Sharyon Cableogers, Veronica C, CNM  fluticasone [facility](FLONASE) 50 MCG/ACT nasal spray 2 sprays per nostril once a day for stuffy nose 11/17/17   Bobbitt, Heywood Ilesalph Carter, MD  ipratropium (ATROVENT) 0.06 % nasal spray Two sprays each nostril three times a day as needed for runny nose. Patient not taking: Reported on 11/23/2018 11/17/17   Bobbitt, Heywood Ilesalph Carter, MD  loratadine (CLARITIN) 10 MG tablet Take 1 tablet (10 mg total) by mouth daily as needed for allergies, rhinitis or itching. 11/17/17   Bobbitt, Heywood Ilesalph Carter, MD  Olopatadine HCl (PAZEO) 0.7 % SOLN Place 1 drop into both eyes 1 day or 1 dose. 11/17/17   Bobbitt, Heywood Ilesalph Carter, MD  ondansetron (ZOFRAN ODT) 4 MG disintegrating tablet Take 1 tablet (4 mg total) by mouth every 8 (eight) hours as needed for nausea or vomiting. 09/16/19   Sharyon Cableogers, Veronica C, CNM  potassium chloride SA (KLOR-CON) 20 MEQ tablet Take 1 tablet (20 mEq total) by mouth 2 (two) times daily for 5 days. 09/17/19 09/22/19  Pollyann SavoySheldon, Charles B, MD  prochlorperazine (COMPAZINE) 10 MG tablet Take 1 tablet (10 mg total) by mouth 2 (two) times daily as needed for nausea or vomiting. 09/17/19   Pollyann SavoySheldon, Charles B, MD  promethazine (PHENERGAN) 25 MG tablet Take 1 tablet (25 mg total) by mouth every 6 (six) hours as needed for nausea or vomiting. 09/19/19   Gailen ShelterFondaw, Wylder S, PA    Allergies    Cantaloupe extract allergy skin test, Citrullus vulgaris, Peanut-containing drug products, Sesame seed (diagnostic), Watermelon flavor, and Other  Review of Systems   Review of Systems  Constitutional: Negative for chills, diaphoresis, fatigue and fever.  HENT: Negative for congestion.   Eyes: Negative for visual disturbance.  Respiratory: Negative for cough and chest tightness.   Cardiovascular: Negative for chest pain, palpitations and leg swelling.  Gastrointestinal: Positive for abdominal pain (mild), nausea and vomiting. Negative for constipation and diarrhea.  Genitourinary: Negative for  dysuria, flank pain and frequency.  Musculoskeletal: Negative for back pain, neck pain and neck stiffness.  Skin: Negative for rash and wound.  Neurological: Negative for syncope, weakness, light-headedness and headaches.  Psychiatric/Behavioral: Negative for agitation and confusion.  All other systems reviewed and are negative.   Physical Exam Updated Vital Signs BP 128/66 (BP Location: Left Arm)   Pulse (!) 50   Temp 98.5 F (36.9 C) (Oral)   Resp 16   Ht 5\' 9"  (1.753 m)   LMP 09/09/2020   SpO2 100%   BMI 22.03 kg/m   Physical Exam Vitals  and nursing note reviewed.  Constitutional:      General: She is not in acute distress.    Appearance: She is well-developed. She is not ill-appearing, toxic-appearing or diaphoretic.  HENT:     Head: Normocephalic and atraumatic.     Mouth/Throat:     Mouth: Mucous membranes are dry.  Eyes:     Extraocular Movements: Extraocular movements intact.     Conjunctiva/sclera: Conjunctivae normal.     Pupils: Pupils are equal, round, and reactive to light.  Cardiovascular:     Rate and Rhythm: Normal rate and regular rhythm.     Heart sounds: No murmur heard.   Pulmonary:     Effort: Pulmonary effort is normal. No respiratory distress.     Breath sounds: Normal breath sounds. No wheezing, rhonchi or rales.  Chest:     Chest wall: No tenderness.  Abdominal:     General: Abdomen is flat.     Palpations: Abdomen is soft.     Tenderness: There is no abdominal tenderness. There is no right CVA tenderness, left CVA tenderness, guarding or rebound.  Musculoskeletal:        General: No tenderness.     Cervical back: Neck supple. No tenderness.  Skin:    General: Skin is warm and dry.     Capillary Refill: Capillary refill takes less than 2 seconds.     Findings: No erythema.  Neurological:     General: No focal deficit present.     Mental Status: She is alert.  Psychiatric:        Mood and Affect: Mood normal.     ED Results /  Procedures / Treatments   Labs (all labs ordered are listed, but only abnormal results are displayed) Labs Reviewed  CBC WITH DIFFERENTIAL/PLATELET - Abnormal; Notable for the following components:      Result Value   WBC 19.6 (*)    Platelets 423 (*)    Neutro Abs 18.1 (*)    Abs Immature Granulocytes 0.13 (*)    All other components within normal limits  COMPREHENSIVE METABOLIC PANEL - Abnormal; Notable for the following components:   Glucose, Bld 110 (*)    Total Protein 8.6 (*)    All other components within normal limits  URINE CULTURE  PREGNANCY, URINE  LIPASE, BLOOD    EKG None  Radiology No results found.  Procedures Procedures   Medications Ordered in ED Medications  ondansetron (ZOFRAN-ODT) disintegrating tablet 4 mg (has no administration in time range)  ondansetron (ZOFRAN) injection 4 mg (4 mg Intravenous Given 09/11/20 1915)  sodium chloride 0.9 % bolus 1,000 mL ( Intravenous Stopped 09/11/20 2022)  ondansetron (ZOFRAN) injection 4 mg (4 mg Intravenous Given 09/11/20 2105)  sodium chloride 0.9 % bolus 1,000 mL ( Intravenous Stopped 09/11/20 2212)    ED Course  I have reviewed the triage vital signs and the nursing notes.  Pertinent labs & imaging results that were available during my care of the patient were reviewed by me and considered in my medical decision making (see chart for details).    MDM Rules/Calculators/A&P                          Madison French is a 25 y.o. female with a past medical history significant for asthma, eczema, and previous C-section who presents with nausea, vomiting, and some abdominal cramping after alcohol use last night.  Patient reported that she drank a significant mild  liquor last night does not member how much she had.  She denies any trauma but is reporting some abdominal aching and nausea and vomiting today.  She reports has not been to keep fluids down today.  She reports no constipation, diarrhea, or urinary changes.   Denies any preceding symptoms whatsoever before her drinking.  Denies any head injury, neck pain, chest pain, or back pain.  She is wanting medication help her feel better with her vomiting.  She reports he is on her menstrual cycle now.  On exam, lungs clear and chest nontender.  Abdomen is nontender.  Patient is having nausea and needing a vomit bag with her.  Bowel sounds appreciated.  Patient otherwise had no evidence of acute trauma.  No focal neurologic deficits.  Patient was given some fluids and nausea medicine and will get some screening labs look for electrolyte imbalance given the full day of nausea vomiting and decreased oral intake.  Given her lack of any preceding symptoms, low suspicion for other causes aside from a dehydration related to her alcohol use last night.  Patient does report smoking marijuana and chart does say that she has had some hyperemesis with marijuana in the past although I think today is more related to the alcohol she reports.  If patient is feeling better and has reassuring work-up and is able to tolerate p.o., anticipate discharge home.     Patient had improvement in her nausea and vomiting.  Work-up similar to prior aside from slightly worsened leukocytosis which she appears to chronically have.  Suspect some demargination.  Other exam reassuring.  Clinical aspect she has dehydrated and hung over from all the nausea and vomiting from her alcohol abuse last night given no preceding symptoms before the alcohol use.  With lack of description of bloody emesis, low suspicion for Boerhaave's, Mallory-Weiss, ulceration, or other acute abnormality.  Patient will follow-up with primary doctor and was given prescription for Zofran ODT.  We offered rectal Phenergan which she did not want.  She agreed with plan of care and was discharged in good condition with improving symptoms.  Final Clinical Impression(s) / ED Diagnoses Final diagnoses:  Non-intractable vomiting with  nausea, unspecified vomiting type  Dehydration  Alcohol use    Rx / DC Orders ED Discharge Orders         Ordered    ondansetron (ZOFRAN ODT) 4 MG disintegrating tablet  Every 8 hours PRN        09/11/20 2322         Clinical Impression: 1. Non-intractable vomiting with nausea, unspecified vomiting type   2. Dehydration   3. Alcohol use     Disposition: Discharge  Condition: Good  I have discussed the results, Dx and Tx plan with the pt(& family if present). He/she/they expressed understanding and agree(s) with the plan. Discharge instructions discussed at great length. Strict return precautions discussed and pt &/or family have verbalized understanding of the instructions. No further questions at time of discharge.    New Prescriptions   ONDANSETRON (ZOFRAN ODT) 4 MG DISINTEGRATING TABLET    Take 1 tablet (4 mg total) by mouth every 8 (eight) hours as needed for nausea or vomiting.    Follow Up: [facility], [facility] [address] [place] [address] [place] [address] [phone]     [facility] [address] [id number] [address] [place] [address] [phone]       Cleophus Mendonsa, Canary Brim, MD 09/11/20 [phone]

## 2020-09-11 NOTE — ED Triage Notes (Signed)
Pt states she drank too much alcohol last night.  Pt c/o abdominal pain and N/V.

## 2020-09-11 NOTE — ED Notes (Signed)
Green clean emesis noted in emesis bag.

## 2020-09-11 NOTE — Discharge Instructions (Signed)
Your history, exam, work-up today are consistent with dehydration likely related to the alcohol intoxication last night and the nausea and vomiting today.  Your laboratory testing is similar to prior with some evidence of dehydration.  We gave you fluids and nausea medicine.  Given your otherwise reassuring exam, we feel you are safe for discharge home, please use the nausea medicine and rest and stay hydrated.  Please follow-up with your primary doctor.  If any symptoms change or worsen, please return to the nearest emergency department.

## 2020-09-14 LAB — URINE CULTURE: Culture: >=100000 — AB

## 2020-09-15 ENCOUNTER — Telehealth: Payer: Self-pay | Admitting: *Deleted

## 2020-09-15 NOTE — Telephone Encounter (Signed)
Post ED Visit - Positive Culture Follow-up  Culture report reviewed by antimicrobial stewardship pharmacist: [facility] []  , Pharm.D. []  Enzo Bi, Pharm.D., BCPS AQ-ID []  , Pharm.D., BCPS []  Celedonio Miyamoto, Pharm.D., BCPS []  [place], Garvin Fila.D., BCPS, AAHIVP []  , Pharm.D., BCPS, AAHIVP []  Georgina Pillion, PharmD, BCPS []  , PharmD, BCPS []  [place], PharmD, BCPS []  [address], PharmD []  , PharmD, BCPS []  Estella Husk, PharmD  Pharmacy Team []  Lysle Pearl, PharmD []  , PharmD []  Phillips Climes, PharmD []  , Rph []  Agapito Games) , PharmD []  Verlan Friends, PharmD []  , PharmD []  Mervyn Gay, PharmD []  , PharmD []  Vinnie Level, PharmD []  [facility], PharmD []  , PharmD []  Len Childs, PharmD   Positive urine culture Asymptomatic bacteriuria and no further patient follow-up is required at this time. , Pharm D Greer Pickerel [place] 09/15/2020, 10:20 AM

## 2020-10-07 ENCOUNTER — Other Ambulatory Visit: Payer: Self-pay

## 2020-10-07 ENCOUNTER — Encounter (HOSPITAL_BASED_OUTPATIENT_CLINIC_OR_DEPARTMENT_OTHER): Payer: Self-pay | Admitting: Emergency Medicine

## 2020-10-07 ENCOUNTER — Emergency Department (HOSPITAL_BASED_OUTPATIENT_CLINIC_OR_DEPARTMENT_OTHER)
Admission: EM | Admit: 2020-10-07 | Discharge: 2020-10-07 | Disposition: A | Discharge status: Home / Self Care | Payer: Medicaid Other | Attending: Emergency Medicine | Admitting: Emergency Medicine

## 2020-10-07 DIAGNOSIS — Z91010 Allergy to peanuts: Secondary | ICD-10-CM | POA: Diagnosis not present

## 2020-10-07 DIAGNOSIS — J4530 Mild persistent asthma, uncomplicated: Secondary | ICD-10-CM | POA: Diagnosis not present

## 2020-10-07 DIAGNOSIS — Z7951 Long term (current) use of inhaled steroids: Secondary | ICD-10-CM | POA: Insufficient documentation

## 2020-10-07 DIAGNOSIS — Z87891 Personal history of nicotine dependence: Secondary | ICD-10-CM | POA: Diagnosis not present

## 2020-10-07 DIAGNOSIS — R197 Diarrhea, unspecified: Secondary | ICD-10-CM

## 2020-10-07 DIAGNOSIS — R1084 Generalized abdominal pain: Secondary | ICD-10-CM

## 2020-10-07 DIAGNOSIS — R112 Nausea with vomiting, unspecified: Secondary | ICD-10-CM | POA: Diagnosis not present

## 2020-10-07 LAB — PREGNANCY, URINE: Preg Test, Ur: NEGATIVE

## 2020-10-07 LAB — CBC
HCT: 40 % (ref 36.0–46.0)
Hemoglobin: 13.7 g/dL (ref 12.0–15.0)
MCH: 32.1 pg (ref 26.0–34.0)
MCHC: 34.3 g/dL (ref 30.0–36.0)
MCV: 93.7 fL (ref 80.0–100.0)
Platelets: 385 10*3/uL (ref 150–400)
RBC: 4.27 MIL/uL (ref 3.87–5.11)
RDW: 12.5 % (ref 11.5–15.5)
WBC: 19 10*3/uL — ABNORMAL HIGH (ref 4.0–10.5)
nRBC: 0 % (ref 0.0–0.2)

## 2020-10-07 LAB — COMPREHENSIVE METABOLIC PANEL
ALT: 21 U/L (ref 0–44)
AST: 28 U/L (ref 15–41)
Albumin: 4.5 g/dL (ref 3.5–5.0)
Alkaline Phosphatase: 59 U/L (ref 38–126)
Anion gap: 10 (ref 5–15)
BUN: 9 mg/dL (ref 6–20)
CO2: 23 mmol/L (ref 22–32)
Calcium: 9.1 mg/dL (ref 8.9–10.3)
Chloride: 105 mmol/L (ref 98–111)
Creatinine, Ser: 0.72 mg/dL (ref 0.44–1.00)
GFR, Estimated: >60 mL/min (ref >60)
Glucose, Bld: 111 mg/dL — ABNORMAL HIGH (ref 70–99)
Potassium: 3.6 mmol/L (ref 3.5–5.1)
Sodium: 138 mmol/L (ref 135–145)
Total Bilirubin: 0.4 mg/dL (ref 0.3–1.2)
Total Protein: 8 g/dL (ref 6.5–8.1)

## 2020-10-07 LAB — URINALYSIS, ROUTINE W REFLEX MICROSCOPIC
Bilirubin Urine: NEGATIVE
Glucose, UA: NEGATIVE mg/dL
Ketones, ur: 40 mg/dL — AB
Leukocytes,Ua: NEGATIVE
Nitrite: NEGATIVE
Protein, ur: NEGATIVE mg/dL
Specific Gravity, Urine: 1.02 (ref 1.005–1.030)
pH: 7.5 (ref 5.0–8.0)

## 2020-10-07 LAB — URINALYSIS, MICROSCOPIC (REFLEX)

## 2020-10-07 LAB — LIPASE, BLOOD: Lipase: 28 U/L (ref 11–51)

## 2020-10-07 MED ORDER — PROMETHAZINE HCL 25 MG RE SUPP: 25.0000 mg | Freq: Four times a day (QID) | RECTAL | 0 refills | Status: AC | PRN

## 2020-10-07 MED ORDER — SODIUM CHLORIDE 0.9 % IV BOLUS
1000.0000 mL | Freq: Once | INTRAVENOUS | Status: AC
  Administered 2020-10-07: 1000 mL via INTRAVENOUS

## 2020-10-07 MED ORDER — METOCLOPRAMIDE HCL 5 MG/ML IJ SOLN
10.0000 mg | Freq: Once | INTRAMUSCULAR | Status: AC
  Administered 2020-10-07: 10 mg via INTRAVENOUS
  Filled 2020-10-07: qty 2

## 2020-10-07 MED ORDER — ONDANSETRON HCL 4 MG/2ML IJ SOLN
4.0000 mg | Freq: Once | INTRAMUSCULAR | Status: AC
  Administered 2020-10-07: 4 mg via INTRAVENOUS
  Filled 2020-10-07: qty 2

## 2020-10-07 NOTE — ED Triage Notes (Signed)
Pt reports abdominal pain, n/v/d x1 day.

## 2020-10-07 NOTE — ED Notes (Signed)
Pt having n/v again. Provider at bedside. Pt aware we need urine to send to lab

## 2020-10-07 NOTE — ED Provider Notes (Signed)
[facility] EMERGENCY DEPARTMENT Provider Note   CSN: [id number] Arrival date & time: 10/07/20  1517     History Chief Complaint  Patient presents with   Abdominal Pain    Madison French is a 25 y.o. female who presents to the ED today with complaint of gradual onset, constant, achy, diffuse, abdominal pain that began this morning. Pt also complains of nausea, NBNB emesis, and 1 episode of looser stools. She admits to heavy marijuana use and drank a mixed drink last night prior to symptoms starting. Pt was recently seen in the ED earlier this month for same suspected to be from marijuana and alcohol use. Pt has not taken anything for her symptoms as she cannot keep anything down. Denies fevers, chills, pelvic pain, vaginal discharge. She is currently on her menstrual cycle.   The history is provided by the patient and medical records.      Past Medical History:  Diagnosis Date   Allergic rhinitis    Asthma    Eczema    Food allergy     Patient Active Problem List   Diagnosis Date Noted   Pregnancy 11/28/2019   Hypotension 11/23/2018   History of tobacco use 11/23/2018   Mild persistent asthma without complication 08/31/2016   Seasonal allergic conjunctivitis 08/31/2016   Seasonal allergic rhinitis due to pollen 08/31/2016   Mild intermittent asthma 09/01/2015   Other allergic rhinitis 09/01/2015   Food allergy 09/01/2015   Flexural atopic dermatitis 09/01/2015   Breast mass, right 06/02/2015   Chronic nonintractable headache 06/02/2015   Flu vaccine need 06/02/2015   Keloid of skin 06/02/2015   Vitamin D deficiency 06/02/2015    Past Surgical History:  Procedure Laterality Date   CESAREAN SECTION     TYMPANOSTOMY TUBE PLACEMENT       OB History     Gravida  2   Para  1   Term      Preterm  1   AB      Living  2      SAB      IAB      Ectopic      Multiple  1   Live Births              Family History  Problem Relation  Age of Onset   Allergic rhinitis Mother    Asthma Mother    Multiple sclerosis Mother    Angioedema Neg Hx    Eczema Neg Hx    Immunodeficiency Neg Hx    Urticaria Neg Hx     Social History   Tobacco Use   Smoking status: Former    Packs/day: 0.25    Years: 3.00    Pack years: 0.75    Types: Cigarettes   Smokeless tobacco: Never  Vaping Use   Vaping Use: Never used  Substance Use Topics   Alcohol use: Yes    Comment: frequent   Drug use: Yes    Types: Marijuana    Comment: last smoked "couple days"    Home Medications Prior to Admission medications   Medication Sig Start Date End Date Taking? Authorizing Provider  promethazine (PHENERGAN) 25 MG suppository Place 1 suppository (25 mg total) rectally every 6 (six) hours as needed for nausea or vomiting. 10/07/20  Yes Hyman Hopes, Ara Mano, PA-C  albuterol (PROVENTIL HFA) 108 (90 Base) MCG/ACT inhaler 2 puffs every 4 hours if needed for wheezing or coughing spells 11/28/19   Bobbitt, Heywood Iles, MD  albuterol (PROVENTIL) (2.5 MG/3ML) 0.083% nebulizer solution Take 3 mLs (2.5 mg total) by nebulization every 4 (four) hours as needed for wheezing or shortness of breath. 11/17/17   Bobbitt, Heywood Ilesalph Carter, MD  budesonide (PULMICORT Linzie CollinFLEXHALER) 180 MCG/ACT inhaler 2 puffs twice daily to prevent coughing or wheezing 11/28/19   Bobbitt, Heywood Ilesalph Carter, MD  budesonide (PULMICORT) 0.5 MG/2ML nebulizer solution Take 2 mLs (0.5 mg total) by nebulization 2 (two) times daily. 11/28/19   Bobbitt, Heywood Ilesalph Carter, MD  budesonide (RHINOCORT AQUA) 32 MCG/ACT nasal spray 1 spray twice daily as needed for stuffy nose. 11/28/19   Bobbitt, Heywood Ilesalph Carter, MD  Crisaborole (EUCRISA) 2 % OINT Apply 1 application topically 2 (two) times daily as needed (to red itchy areas). Patient not taking: No sig reported 11/23/18   Bobbitt, Heywood Ilesalph Carter, MD  Crisaborole (EUCRISA) 2 % OINT Apply  Twice daily to affected areas as needed. 11/28/19   Bobbitt, Heywood Ilesalph Carter, MD  EPINEPHrine  (EPIPEN 2-PAK) 0.3 mg/0.3 mL IJ SOAJ injection Use as directed for severe allergic reaction. 11/28/19   Bobbitt, Heywood Ilesalph Carter, MD  famotidine (PEPCID) 20 MG tablet Take 1 tablet (20 mg total) by mouth 2 (two) times daily. Patient not taking: Reported on 11/28/2019 09/16/19   Sharyon Cableogers, Veronica C, CNM  fluticasone [facility](FLONASE) 50 MCG/ACT nasal spray 2 sprays per nostril once a day for stuffy nose 11/17/17   Bobbitt, Heywood Ilesalph Carter, MD  ipratropium (ATROVENT) 0.06 % nasal spray Two sprays each nostril three times a day as needed for runny nose. Patient not taking: Reported on 11/23/2018 11/17/17   Bobbitt, Heywood Ilesalph Carter, MD  loratadine (CLARITIN) 10 MG tablet Take 1 tablet (10 mg total) by mouth daily as needed for allergies, rhinitis or itching. 11/17/17   Bobbitt, Heywood Ilesalph Carter, MD  Olopatadine HCl (PAZEO) 0.7 % SOLN Place 1 drop into both eyes 1 day or 1 dose. 11/17/17   Bobbitt, Heywood Ilesalph Carter, MD  ondansetron (ZOFRAN ODT) 4 MG disintegrating tablet Take 1 tablet (4 mg total) by mouth every 8 (eight) hours as needed for nausea or vomiting. 09/16/19   Sharyon Cableogers, Veronica C, CNM  ondansetron (ZOFRAN ODT) 4 MG disintegrating tablet Take 1 tablet (4 mg total) by mouth every 8 (eight) hours as needed for nausea or vomiting. 09/11/20   Tegeler, Canary Brimhristopher J, MD  potassium chloride SA (KLOR-CON) 20 MEQ tablet Take 1 tablet (20 mEq total) by mouth 2 (two) times daily for 5 days. 09/17/19 09/22/19  Pollyann SavoySheldon, Charles B, MD  prochlorperazine (COMPAZINE) 10 MG tablet Take 1 tablet (10 mg total) by mouth 2 (two) times daily as needed for nausea or vomiting. 09/17/19   Pollyann SavoySheldon, Charles B, MD  promethazine (PHENERGAN) 25 MG tablet Take 1 tablet (25 mg total) by mouth every 6 (six) hours as needed for nausea or vomiting. 09/19/19   Gailen ShelterFondaw, Wylder S, PA    Allergies    Cantaloupe extract allergy skin test, Citrullus vulgaris, Peanut-containing drug products, Sesame seed (diagnostic), Watermelon flavor, and Other  Review of Systems   Review of  Systems  Constitutional:  Negative for chills and fever.  Gastrointestinal:  Positive for abdominal pain, diarrhea, nausea and vomiting.  Genitourinary:  Negative for pelvic pain and vaginal discharge.  All other systems reviewed and are negative.  Physical Exam Updated Vital Signs BP (!) 151/113 (BP Location: Left Arm)   Pulse (!) 59   Temp 98 F (36.7 C) (Oral)   Resp 20   Ht 5\' 9"  (1.753 m)   Wt 72.6 kg  LMP 10/07/2020   SpO2 100%   Breastfeeding No   BMI 23.63 kg/m   Physical Exam Vitals and nursing note reviewed.  Constitutional:      Appearance: She is not ill-appearing or diaphoretic.  HENT:     Head: Normocephalic and atraumatic.  Eyes:     Conjunctiva/sclera: Conjunctivae normal.  Cardiovascular:     Rate and Rhythm: Normal rate and regular rhythm.     Heart sounds: Normal heart sounds.  Pulmonary:     Effort: Pulmonary effort is normal.     Breath sounds: Normal breath sounds. No wheezing, rhonchi or rales.  Abdominal:     General: Abdomen is flat. Bowel sounds are normal.     Palpations: Abdomen is soft.     Tenderness: There is no abdominal tenderness. There is no guarding or rebound.  Musculoskeletal:     Cervical back: Neck supple.  Skin:    General: Skin is warm and dry.  Neurological:     Mental Status: She is alert.    ED Results / Procedures / Treatments   Labs (all labs ordered are listed, but only abnormal results are displayed) Labs Reviewed  COMPREHENSIVE METABOLIC PANEL - Abnormal; Notable for the following components:      Result Value   Glucose, Bld 111 (*)    All other components within normal limits  CBC - Abnormal; Notable for the following components:   WBC 19.0 (*)    All other components within normal limits  URINALYSIS, ROUTINE W REFLEX MICROSCOPIC - Abnormal; Notable for the following components:   Hgb urine dipstick MODERATE (*)    Ketones, ur 40 (*)    All other components within normal limits  URINALYSIS, MICROSCOPIC  (REFLEX) - Abnormal; Notable for the following components:   Bacteria, UA RARE (*)    All other components within normal limits  LIPASE, BLOOD  PREGNANCY, URINE    EKG None  Radiology No results found.  Procedures Procedures   Medications Ordered in ED Medications  sodium chloride 0.9 % bolus 1,000 mL (0 mLs Intravenous Paused 10/07/20 1717)  ondansetron (ZOFRAN) injection 4 mg (4 mg Intravenous Given 10/07/20 1716)  metoCLOPramide (REGLAN) injection 10 mg (10 mg Intravenous Given 10/07/20 1844)    ED Course  I have reviewed the triage vital signs and the nursing notes.  Pertinent labs & imaging results that were available during my care of the patient were reviewed by me and considered in my medical decision making (see chart for details).    MDM Rules/Calculators/A&P                          25 year old female who presents to the ED today complaining of diffuse abdominal pain, nausea, vomiting, diarrhea that began this morning.  History of same likely related to cannabis and alcohol use.  She is continuing to smoke marijuana and she last drank last night.  On arrival to the ED today patient is afebrile, nontachycardic and nontachypneic and appears to be in no acute distress.  She has normal active bowel sounds, no rebound or guarding and no obvious abdominal tenderness with a soft abdomen.  Very low suspicion for acute abdomen at this time.  Will provide fluids and antiemetics and obtain labs with reevaluation at this time.  CBC with leukocytosis of 19,000, unchanged from previous. Doubt infectious etiology at this time.   WBC  Date Value Ref Range Status  10/07/2020 19.0 (H) 4.0 - 10.5  K/uL Final  09/11/2020 19.6 (H) 4.0 - 10.5 K/uL Final  09/19/2019 17.8 (H) 4.0 - 10.5 K/uL Final  09/16/2019 12.3 (H) 4.0 - 10.5 K/uL Final   CMP with glucose 111. No other electrolyte abnormalities. LFTs unremarkable.  Lipase 28  On reevaluation pt dry heaving. Additional antiemetics  provided for patient. Awaiting urine sample at this time.   U/A with moderate hgb, 40 ketones, and 6-10 RBCs per HPF. Pt currently on her menstrual cycle UPT negative  On reevaluation pt resting comfortably in bed. She has passed her fluid challenge at this time. Will discharge home at this time.   This note was prepared using Dragon voice recognition software and may include unintentional dictation errors due to the inherent limitations of voice recognition software.   Final Clinical Impression(s) / ED Diagnoses Final diagnoses:  Generalized abdominal pain  Nausea vomiting and diarrhea    Rx / DC Orders ED Discharge Orders          Ordered    promethazine (PHENERGAN) 25 MG suppository  Every 6 hours PRN        10/07/20 1942             Discharge Instructions      Please pick up medication and take as prescribed to help with nausea/vomiting Follow up with  your PCP for further evaluation   It is recommended that you refrain from marijuana use as it is likely causing rebound nausea and vomiting  Return to the ED for any new/worsening symptoms        Tanda Rockers, PA-C 10/07/20 1958    Tegeler, Canary Brim, MD 10/07/20 2249

## 2020-10-07 NOTE — ED Notes (Signed)
Pt verbally aggressive in triage towards staff. Pt aware no room is currently available.

## 2020-10-07 NOTE — Discharge Instructions (Addendum)
Please pick up medication and take as prescribed to help with nausea/vomiting Follow up with  your PCP for further evaluation   It is recommended that you refrain from marijuana use as it is likely causing rebound nausea and vomiting  Return to the ED for any new/worsening symptoms

## 2020-10-07 NOTE — ED Notes (Signed)
In /out cath not performed, received a clean cath specimen

## 2021-03-05 ENCOUNTER — Encounter (HOSPITAL_BASED_OUTPATIENT_CLINIC_OR_DEPARTMENT_OTHER): Payer: Self-pay | Admitting: *Deleted

## 2021-03-05 ENCOUNTER — Emergency Department (HOSPITAL_BASED_OUTPATIENT_CLINIC_OR_DEPARTMENT_OTHER)
Admission: EM | Admit: 2021-03-05 | Discharge: 2021-03-05 | Disposition: A | Discharge status: Home / Self Care | Payer: Medicaid Other | Attending: Emergency Medicine | Admitting: Emergency Medicine

## 2021-03-05 ENCOUNTER — Other Ambulatory Visit: Payer: Self-pay

## 2021-03-05 DIAGNOSIS — N760 Acute vaginitis: Secondary | ICD-10-CM | POA: Diagnosis not present

## 2021-03-05 DIAGNOSIS — Z87891 Personal history of nicotine dependence: Secondary | ICD-10-CM | POA: Insufficient documentation

## 2021-03-05 DIAGNOSIS — Z91010 Allergy to peanuts: Secondary | ICD-10-CM | POA: Insufficient documentation

## 2021-03-05 DIAGNOSIS — J069 Acute upper respiratory infection, unspecified: Secondary | ICD-10-CM | POA: Diagnosis not present

## 2021-03-05 DIAGNOSIS — B9689 Other specified bacterial agents as the cause of diseases classified elsewhere: Secondary | ICD-10-CM | POA: Insufficient documentation

## 2021-03-05 DIAGNOSIS — N390 Urinary tract infection, site not specified: Secondary | ICD-10-CM | POA: Diagnosis not present

## 2021-03-05 DIAGNOSIS — Z20822 Contact with and (suspected) exposure to covid-19: Secondary | ICD-10-CM | POA: Diagnosis not present

## 2021-03-05 DIAGNOSIS — J4530 Mild persistent asthma, uncomplicated: Secondary | ICD-10-CM | POA: Diagnosis not present

## 2021-03-05 DIAGNOSIS — Z7951 Long term (current) use of inhaled steroids: Secondary | ICD-10-CM | POA: Diagnosis not present

## 2021-03-05 DIAGNOSIS — R059 Cough, unspecified: Secondary | ICD-10-CM | POA: Diagnosis present

## 2021-03-05 DIAGNOSIS — Z113 Encounter for screening for infections with a predominantly sexual mode of transmission: Secondary | ICD-10-CM | POA: Diagnosis not present

## 2021-03-05 LAB — WET PREP, GENITAL
Sperm: NONE SEEN
Trich, Wet Prep: NONE SEEN
WBC, Wet Prep HPF POC: <10 (ref <10)
Yeast Wet Prep HPF POC: NONE SEEN

## 2021-03-05 LAB — URINALYSIS, ROUTINE W REFLEX MICROSCOPIC
Glucose, UA: NEGATIVE mg/dL
Ketones, ur: 15 mg/dL — AB
Nitrite: POSITIVE — AB
Protein, ur: 100 mg/dL — AB
Specific Gravity, Urine: 1.02 (ref 1.005–1.030)
pH: 6 (ref 5.0–8.0)

## 2021-03-05 LAB — URINALYSIS, MICROSCOPIC (REFLEX): WBC, UA: >50 WBC/hpf (ref 0–5)

## 2021-03-05 LAB — RESP PANEL BY RT-PCR (FLU A&B, COVID) ARPGX2
Influenza A by PCR: NEGATIVE
Influenza B by PCR: NEGATIVE
SARS Coronavirus 2 by RT PCR: NEGATIVE

## 2021-03-05 LAB — PREGNANCY, URINE: Preg Test, Ur: NEGATIVE

## 2021-03-05 MED ORDER — METRONIDAZOLE 500 MG PO TABS: 500.0000 mg | ORAL_TABLET | Freq: Two times a day (BID) | ORAL | 0 refills | Status: AC

## 2021-03-05 MED ORDER — ACETAMINOPHEN 325 MG PO TABS: 650.0000 mg | ORAL_TABLET | Freq: Four times a day (QID) | ORAL | 0 refills | Status: AC | PRN

## 2021-03-05 MED ORDER — AZITHROMYCIN 250 MG PO TABS
1000.0000 mg | ORAL_TABLET | Freq: Once | ORAL | Status: AC
  Administered 2021-03-05: 1000 mg via ORAL
  Filled 2021-03-05: qty 4

## 2021-03-05 MED ORDER — CEPHALEXIN 500 MG PO CAPS: 500.0000 mg | ORAL_CAPSULE | Freq: Two times a day (BID) | ORAL | 0 refills | Status: AC

## 2021-03-05 MED ORDER — CEFTRIAXONE SODIUM 500 MG IJ SOLR
500.0000 mg | Freq: Once | INTRAMUSCULAR | Status: AC
  Administered 2021-03-05: 500 mg via INTRAMUSCULAR
  Filled 2021-03-05: qty 500

## 2021-03-05 MED ORDER — GUAIFENESIN 100 MG/5ML PO LIQD: 5.0000 mL | ORAL | 0 refills | Status: AC | PRN

## 2021-03-05 MED ORDER — LIDOCAINE HCL (PF) 1 % IJ SOLN
1.0000 mL | Freq: Once | INTRAMUSCULAR | Status: AC
  Administered 2021-03-05: 1 mL
  Filled 2021-03-05: qty 5

## 2021-03-05 MED ORDER — ACETAMINOPHEN 325 MG PO TABS
650.0000 mg | ORAL_TABLET | Freq: Once | ORAL | Status: AC
  Administered 2021-03-05: 650 mg via ORAL
  Filled 2021-03-05: qty 2

## 2021-03-05 MED ORDER — METRONIDAZOLE 500 MG PO TABS
500.0000 mg | ORAL_TABLET | Freq: Once | ORAL | Status: AC
  Administered 2021-03-05: 500 mg via ORAL
  Filled 2021-03-05: qty 1

## 2021-03-05 NOTE — ED Provider Notes (Addendum)
[facility] EMERGENCY DEPARTMENT Provider Note   CSN: [id number] Arrival date & time: 03/05/21  1249     History Chief Complaint  Patient presents with   URI    Madison French is a 25 y.o. female.  This is a 25 y.o. female with significant medical history as below, including STI, eczema who presents to the ED with complaint of URI symptoms, vaginal discharge.  Patient reports your symptoms over the past 24 to 48 hours including cough, runny nose, body aches, intermittent headache.  Her daughter has similar symptoms at home and was tested positive recently for RSV.  Patient denies difficulty breathing, chest pain.  She is having intermittent fevers and chills.  No antipyretics prior to arrival.  She is tolerating oral intake without difficulty.  No nausea or vomiting.  No change in bowel or bladder function.  She has some transient dysuria over the past 24 hours, white/yellow vaginal discharge that is malodorous over the past 24 hours.  She is in a monogamous relationship and denies known exposure to STI.  No abdominal pain.  No pelvic pain.  No Abnormal vaginal bleeding  The history is provided by the patient. No language interpreter was used.  URI Presenting symptoms: congestion, cough, fatigue, fever and rhinorrhea   Severity:  Mild Onset quality:  Gradual Associated symptoms: no headaches       Past Medical History:  Diagnosis Date   Allergic rhinitis    Asthma    Eczema    Food allergy     Patient Active Problem List   Diagnosis Date Noted   Pregnancy 11/28/2019   Hypotension 11/23/2018   History of tobacco use 11/23/2018   Mild persistent asthma without complication 08/31/2016   Seasonal allergic conjunctivitis 08/31/2016   Seasonal allergic rhinitis due to pollen 08/31/2016   Mild intermittent asthma 09/01/2015   Other allergic rhinitis 09/01/2015   Food allergy 09/01/2015   Flexural atopic dermatitis 09/01/2015   Breast mass, right 06/02/2015    Chronic nonintractable headache 06/02/2015   Flu vaccine need 06/02/2015   Keloid of skin 06/02/2015   Vitamin D deficiency 06/02/2015    Past Surgical History:  Procedure Laterality Date   CESAREAN SECTION     TYMPANOSTOMY TUBE PLACEMENT       OB History     Gravida  2   Para  1   Term      Preterm  1   AB      Living  2      SAB      IAB      Ectopic      Multiple  1   Live Births              Family History  Problem Relation Age of Onset   Allergic rhinitis Mother    Asthma Mother    Multiple sclerosis Mother    Angioedema Neg Hx    Eczema Neg Hx    Immunodeficiency Neg Hx    Urticaria Neg Hx     Social History   Tobacco Use   Smoking status: Former    Packs/day: 0.25    Years: 3.00    Pack years: 0.75    Types: Cigarettes   Smokeless tobacco: Never  Vaping Use   Vaping Use: Never used  Substance Use Topics   Alcohol use: Yes    Comment: frequent   Drug use: Yes    Types: Marijuana    Comment: last smoked "couple  days"    Home Medications Prior to Admission medications   Medication Sig Start Date End Date Taking? Authorizing Provider  acetaminophen (TYLENOL) 325 MG tablet Take 2 tablets (650 mg total) by mouth every 6 (six) hours as needed. 03/05/21  Yes Wynona Dove A, DO  cephALEXin (KEFLEX) 500 MG capsule Take 1 capsule (500 mg total) by mouth 2 (two) times daily for 10 days. 03/05/21 03/15/21 Yes Jeanell Sparrow, DO  guaiFENesin (ROBITUSSIN) 100 MG/5ML liquid Take 5 mLs by mouth every 4 (four) hours as needed for cough or to loosen phlegm. 03/05/21  Yes Wynona Dove A, DO  metroNIDAZOLE (FLAGYL) 500 MG tablet Take 1 tablet (500 mg total) by mouth 2 (two) times daily for 7 days. 03/06/21 03/13/21 Yes Wynona Dove A, DO  albuterol (PROVENTIL HFA) [phone] Base) MCG/ACT inhaler 2 puffs every 4 hours if needed for wheezing or coughing spells 11/28/19   Bobbitt, Sedalia Muta, MD  albuterol (PROVENTIL) (2.5 MG/3ML) 0.083% nebulizer solution  Take 3 mLs (2.5 mg total) by nebulization every 4 (four) hours as needed for wheezing or shortness of breath. 11/17/17   Bobbitt, Sedalia Muta, MD  budesonide (PULMICORT Wannetta Sender) 180 MCG/ACT inhaler 2 puffs twice daily to prevent coughing or wheezing 11/28/19   Bobbitt, Sedalia Muta, MD  budesonide (PULMICORT) 0.5 MG/2ML nebulizer solution Take 2 mLs (0.5 mg total) by nebulization 2 (two) times daily. 11/28/19   Bobbitt, Sedalia Muta, MD  budesonide (RHINOCORT AQUA) 32 MCG/ACT nasal spray 1 spray twice daily as needed for stuffy nose. 11/28/19   Bobbitt, Sedalia Muta, MD  Crisaborole (EUCRISA) 2 % OINT Apply 1 application topically 2 (two) times daily as needed (to red itchy areas). Patient not taking: Reported on 11/28/2019 11/23/18   Bobbitt, Sedalia Muta, MD  Crisaborole (EUCRISA) 2 % OINT Apply  Twice daily to affected areas as needed. 11/28/19   Bobbitt, Sedalia Muta, MD  EPINEPHrine (EPIPEN 2-PAK) 0.3 mg/0.3 mL IJ SOAJ injection Use as directed for severe allergic reaction. 11/28/19   Bobbitt, Sedalia Muta, MD  famotidine (PEPCID) 20 MG tablet Take 1 tablet (20 mg total) by mouth 2 (two) times daily. Patient not taking: Reported on 11/28/2019 09/16/19   Lajean Manes, CNM  fluticasone [facility]) 50 MCG/ACT nasal spray 2 sprays per nostril once a day for stuffy nose 11/17/17   Bobbitt, Sedalia Muta, MD  ipratropium (ATROVENT) 0.06 % nasal spray Two sprays each nostril three times a day as needed for runny nose. Patient not taking: Reported on 11/23/2018 11/17/17   Bobbitt, Sedalia Muta, MD  loratadine (CLARITIN) 10 MG tablet Take 1 tablet (10 mg total) by mouth daily as needed for allergies, rhinitis or itching. 11/17/17   Bobbitt, Sedalia Muta, MD  Olopatadine HCl (PAZEO) 0.7 % SOLN Place 1 drop into both eyes 1 day or 1 dose. 11/17/17   Bobbitt, Sedalia Muta, MD  ondansetron (ZOFRAN ODT) 4 MG disintegrating tablet Take 1 tablet (4 mg total) by mouth every 8 (eight) hours as needed for nausea or vomiting.  09/16/19   Lajean Manes, CNM  ondansetron (ZOFRAN ODT) 4 MG disintegrating tablet Take 1 tablet (4 mg total) by mouth every 8 (eight) hours as needed for nausea or vomiting. 09/11/20   Tegeler, Gwenyth Allegra, MD  potassium chloride SA (KLOR-CON) 20 MEQ tablet Take 1 tablet (20 mEq total) by mouth 2 (two) times daily for 5 days. 09/17/19 09/22/19  Truddie Hidden, MD  prochlorperazine (COMPAZINE) 10 MG tablet Take 1 tablet (10 mg  total) by mouth 2 (two) times daily as needed for nausea or vomiting. 09/17/19   Truddie Hidden, MD  promethazine (PHENERGAN) 25 MG suppository Place 1 suppository (25 mg total) rectally every 6 (six) hours as needed for nausea or vomiting. 10/07/20   Eustaquio Maize, PA-C  promethazine (PHENERGAN) 25 MG tablet Take 1 tablet (25 mg total) by mouth every 6 (six) hours as needed for nausea or vomiting. 09/19/19   Tedd Sias, PA    Allergies    Cantaloupe extract allergy skin test, Citrullus vulgaris, Peanut-containing drug products, Sesame seed (diagnostic), Watermelon flavor, and Other  Review of Systems   Review of Systems  Constitutional:  Positive for fatigue and fever. Negative for activity change.  HENT:  Positive for congestion, postnasal drip and rhinorrhea. Negative for facial swelling and trouble swallowing.   Eyes:  Negative for discharge and redness.  Respiratory:  Positive for cough. Negative for shortness of breath.   Cardiovascular:  Negative for chest pain and palpitations.  Gastrointestinal:  Negative for abdominal pain and nausea.  Genitourinary:  Negative for dysuria and flank pain.  Musculoskeletal:  Negative for back pain and gait problem.  Skin:  Negative for pallor and rash.  Neurological:  Negative for syncope and headaches.   Physical Exam Updated Vital Signs BP 121/76 (BP Location: Right Arm)   Pulse (!) 119   Temp 98.7 F (37.1 C) (Oral)   Resp 18   Ht 5\' 9"  (1.753 m)   Wt 72.6 kg   LMP 02/03/2021   SpO2 100%   BMI 23.64  kg/m   Physical Exam Vitals and nursing note reviewed.  Constitutional:      General: She is not in acute distress.    Appearance: Normal appearance.  HENT:     Head: Normocephalic and atraumatic.     Right Ear: External ear normal.     Left Ear: External ear normal.     Nose: Nose normal.     Mouth/Throat:     Mouth: Mucous membranes are moist.  Eyes:     General: No scleral icterus.       Right eye: No discharge.        Left eye: No discharge.  Cardiovascular:     Rate and Rhythm: Normal rate and regular rhythm.     Pulses: Normal pulses.     Heart sounds: Normal heart sounds.  Pulmonary:     Effort: Pulmonary effort is normal. No respiratory distress.     Breath sounds: Normal breath sounds.  Abdominal:     General: Abdomen is flat.     Palpations: Abdomen is soft.     Tenderness: There is no abdominal tenderness.  Musculoskeletal:        General: Normal range of motion.     Cervical back: Normal range of motion.     Right lower leg: No edema.     Left lower leg: No edema.  Skin:    General: Skin is warm and dry.     Capillary Refill: Capillary refill takes less than 2 seconds.  Neurological:     Mental Status: She is alert.  Psychiatric:        Mood and Affect: Mood normal.        Behavior: Behavior normal.    ED Results / Procedures / Treatments   Labs (all labs ordered are listed, but only abnormal results are displayed) Labs Reviewed  WET PREP, GENITAL - Abnormal; Notable for the following components:  Result Value   Clue Cells Wet Prep HPF POC PRESENT (*)    All other components within normal limits  URINALYSIS, ROUTINE W REFLEX MICROSCOPIC - Abnormal; Notable for the following components:   APPearance CLOUDY (*)    Hgb urine dipstick LARGE (*)    Bilirubin Urine SMALL (*)    Ketones, ur 15 (*)    Protein, ur 100 (*)    Nitrite POSITIVE (*)    Leukocytes,Ua SMALL (*)    All other components within normal limits  URINALYSIS, MICROSCOPIC  (REFLEX) - Abnormal; Notable for the following components:   Bacteria, UA MANY (*)    All other components within normal limits  RESP PANEL BY RT-PCR (FLU A&B, COVID) ARPGX2  URINE CULTURE  PREGNANCY, URINE  GC/CHLAMYDIA PROBE AMP ([place]) NOT AT [facility]    EKG None  Radiology No results found.  Procedures Procedures   Medications Ordered in ED Medications  acetaminophen (TYLENOL) tablet 650 mg (650 mg Oral Given 03/05/21 1315)  cefTRIAXone (ROCEPHIN) injection 500 mg (500 mg Intramuscular Given 03/05/21 1432)  lidocaine (PF) (XYLOCAINE) 1 % injection 1 mL (1 mL Other Given 03/05/21 1433)  azithromycin (ZITHROMAX) tablet 1,000 mg (1,000 mg Oral Given 03/05/21 1431)  metroNIDAZOLE (FLAGYL) tablet 500 mg (500 mg Oral Given 03/05/21 1431)    ED Course  I have reviewed the triage vital signs and the nursing notes.  Pertinent labs & imaging results that were available during my care of the patient were reviewed by me and considered in my medical decision making (see chart for details).    MDM Rules/Calculators/A&P                          CC: uri symptoms, vaginal discharge  This patient complains of above; this involves an extensive number of treatment options and is a complaint that carries with it a high risk of complications and morbidity. Vital signs were reviewed. Serious etiologies considered.  Record review:   Previous records obtained and reviewed   Work up as above, notable for:  Labs results that were available during my care of the patient were reviewed by me and considered in my medical decision making.   Management: Patient with concern for STI given abnormal vaginal discharge and dysuria, she has elected to self swab.  She is agreeable to empiric treatment for STI.  Patient found to have clue cells on wet prep, will treat for bacterial vaginosis.  Patient also found to have nitrite positive UTI.  Urine culture sent.  There is significant contamination.   Will start on Keflex for presumed UTI.  Patient was also given Rocephin and azithromycin for STI treatment.  Viral swabs are negative.  Presumed viral URI as etiology of patient's upper respiratory symptoms.  Discussed supportive care regarding symptoms today to the patient.  Return precautions.  Advised patient to refrain from sexual to be to completion of her antibiotics and if her STI testing is positive that she refer her partner to be tested as well.  My suspicion for PID is very low.   Heart rate at time of final assessment is 100-102. Afebrile.   The patient improved significantly and was discharged in stable condition. Detailed discussions were had with the patient regarding current findings, and need for close f/u with PCP or on call doctor. The patient has been instructed to return immediately if the symptoms worsen in any way for re-evaluation. Patient verbalized understanding and is in  agreement with current care plan. All questions answered prior to discharge.      This chart was dictated using voice recognition software.  Despite best efforts to proofread,  errors can occur which can change the documentation meaning.  Final Clinical Impression(s) / ED Diagnoses Final diagnoses:  Bacterial vaginosis  Urinary tract infection without hematuria, site unspecified  Viral URI with cough  Screening for STD (sexually transmitted disease)    Rx / DC Orders ED Discharge Orders          Ordered    metroNIDAZOLE (FLAGYL) 500 MG tablet  2 times daily        03/05/21 1418    cephALEXin (KEFLEX) 500 MG capsule  2 times daily        03/05/21 1440    acetaminophen (TYLENOL) 325 MG tablet  Every 6 hours PRN        03/05/21 1442    guaiFENesin (ROBITUSSIN) 100 MG/5ML liquid  Every 4 hours PRN        03/05/21 1442             Jeanell Sparrow, DO 03/05/21 1453    Jeanell Sparrow, DO 03/05/21 1454

## 2021-03-05 NOTE — ED Notes (Signed)
Pt self-swabbed for sample

## 2021-03-05 NOTE — ED Triage Notes (Signed)
Headache, cough, runny nose and body aches. Her daughter has RSV.

## 2021-03-05 NOTE — Discharge Instructions (Addendum)
Please refrain from sexual activity until completion of your antibiotics.  No alcohol use while taking antibiotics.

## 2021-03-06 LAB — GC/CHLAMYDIA PROBE AMP (~~LOC~~) NOT AT ARMC
Chlamydia: NEGATIVE
Comment: NEGATIVE
Comment: NORMAL
Neisseria Gonorrhea: NEGATIVE

## 2021-03-08 LAB — URINE CULTURE
Culture: >=100000 — AB
Special Requests: NORMAL

## 2021-08-17 ENCOUNTER — Encounter (HOSPITAL_BASED_OUTPATIENT_CLINIC_OR_DEPARTMENT_OTHER): Payer: Self-pay | Admitting: Emergency Medicine

## 2021-08-17 ENCOUNTER — Other Ambulatory Visit (HOSPITAL_BASED_OUTPATIENT_CLINIC_OR_DEPARTMENT_OTHER): Payer: Self-pay

## 2021-08-17 ENCOUNTER — Emergency Department (HOSPITAL_BASED_OUTPATIENT_CLINIC_OR_DEPARTMENT_OTHER)
Admission: EM | Admit: 2021-08-17 | Discharge: 2021-08-17 | Disposition: A | Discharge status: Home / Self Care | Payer: Medicaid Other | Attending: Emergency Medicine | Admitting: Emergency Medicine

## 2021-08-17 ENCOUNTER — Emergency Department (HOSPITAL_BASED_OUTPATIENT_CLINIC_OR_DEPARTMENT_OTHER): Payer: Medicaid Other

## 2021-08-17 ENCOUNTER — Other Ambulatory Visit: Payer: Self-pay

## 2021-08-17 DIAGNOSIS — J45909 Unspecified asthma, uncomplicated: Secondary | ICD-10-CM | POA: Insufficient documentation

## 2021-08-17 DIAGNOSIS — N3000 Acute cystitis without hematuria: Secondary | ICD-10-CM | POA: Diagnosis not present

## 2021-08-17 DIAGNOSIS — Z7951 Long term (current) use of inhaled steroids: Secondary | ICD-10-CM | POA: Insufficient documentation

## 2021-08-17 DIAGNOSIS — E876 Hypokalemia: Secondary | ICD-10-CM | POA: Diagnosis not present

## 2021-08-17 DIAGNOSIS — Z91010 Allergy to peanuts: Secondary | ICD-10-CM | POA: Diagnosis not present

## 2021-08-17 DIAGNOSIS — R112 Nausea with vomiting, unspecified: Secondary | ICD-10-CM

## 2021-08-17 LAB — URINALYSIS, ROUTINE W REFLEX MICROSCOPIC
Bilirubin Urine: NEGATIVE
Glucose, UA: NEGATIVE mg/dL
Ketones, ur: 40 mg/dL — AB
Nitrite: NEGATIVE
Protein, ur: >=300 mg/dL — AB
Specific Gravity, Urine: >=1.03 (ref 1.005–1.030)
pH: 6 (ref 5.0–8.0)

## 2021-08-17 LAB — COMPREHENSIVE METABOLIC PANEL
ALT: 16 U/L (ref 0–44)
AST: 23 U/L (ref 15–41)
Albumin: 4.9 g/dL (ref 3.5–5.0)
Alkaline Phosphatase: 64 U/L (ref 38–126)
Anion gap: 14 (ref 5–15)
BUN: 12 mg/dL (ref 6–20)
CO2: 21 mmol/L — ABNORMAL LOW (ref 22–32)
Calcium: 9.6 mg/dL (ref 8.9–10.3)
Chloride: 101 mmol/L (ref 98–111)
Creatinine, Ser: 0.84 mg/dL (ref 0.44–1.00)
GFR, Estimated: >60 mL/min (ref >60)
Glucose, Bld: 122 mg/dL — ABNORMAL HIGH (ref 70–99)
Potassium: 2.5 mmol/L — CL (ref 3.5–5.1)
Sodium: 136 mmol/L (ref 135–145)
Total Bilirubin: 0.5 mg/dL (ref 0.3–1.2)
Total Protein: 8.9 g/dL — ABNORMAL HIGH (ref 6.5–8.1)

## 2021-08-17 LAB — CBC
HCT: 41.2 % (ref 36.0–46.0)
Hemoglobin: 14.5 g/dL (ref 12.0–15.0)
MCH: 31.3 pg (ref 26.0–34.0)
MCHC: 35.2 g/dL (ref 30.0–36.0)
MCV: 89 fL (ref 80.0–100.0)
Platelets: 556 10*3/uL — ABNORMAL HIGH (ref 150–400)
RBC: 4.63 MIL/uL (ref 3.87–5.11)
RDW: 12.8 % (ref 11.5–15.5)
WBC: 21.7 10*3/uL — ABNORMAL HIGH (ref 4.0–10.5)
nRBC: 0 % (ref 0.0–0.2)

## 2021-08-17 LAB — PREGNANCY, URINE: Preg Test, Ur: NEGATIVE

## 2021-08-17 LAB — URINALYSIS, MICROSCOPIC (REFLEX): WBC, UA: >50 WBC/hpf (ref 0–5)

## 2021-08-17 LAB — LIPASE, BLOOD: Lipase: 33 U/L (ref 11–51)

## 2021-08-17 MED ORDER — CEPHALEXIN 500 MG PO CAPS
500.0000 mg | ORAL_CAPSULE | Freq: Four times a day (QID) | ORAL | 0 refills | Status: AC
  Filled 2021-08-17 (×2): qty 28, 7d supply, fill #0

## 2021-08-17 MED ORDER — SODIUM CHLORIDE 0.9 % IV BOLUS
1000.0000 mL | Freq: Once | INTRAVENOUS | Status: AC
Start: 2021-08-17 — End: 2021-08-17
  Administered 2021-08-17: 1000 mL via INTRAVENOUS

## 2021-08-17 MED ORDER — HYDROMORPHONE HCL 1 MG/ML IJ SOLN
1.0000 mg | Freq: Once | INTRAMUSCULAR | Status: AC
Start: 2021-08-17 — End: 2021-08-17
  Administered 2021-08-17: 1 mg via INTRAVENOUS
  Filled 2021-08-17: qty 1

## 2021-08-17 MED ORDER — SODIUM CHLORIDE 0.9 % IV BOLUS
1000.0000 mL | Freq: Once | INTRAVENOUS | Status: AC
  Administered 2021-08-17: 1000 mL via INTRAVENOUS

## 2021-08-17 MED ORDER — ONDANSETRON 4 MG PO TBDP
4.0000 mg | ORAL_TABLET | Freq: Three times a day (TID) | ORAL | 1 refills | Status: AC | PRN
  Filled 2021-08-17: qty 12, 4d supply, fill #0

## 2021-08-17 MED ORDER — ONDANSETRON HCL 4 MG/2ML IJ SOLN
4.0000 mg | Freq: Once | INTRAMUSCULAR | Status: AC
  Administered 2021-08-17: 4 mg via INTRAVENOUS
  Filled 2021-08-17: qty 2

## 2021-08-17 MED ORDER — PANTOPRAZOLE SODIUM 40 MG IV SOLR
40.0000 mg | Freq: Once | INTRAVENOUS | Status: AC
  Administered 2021-08-17: 40 mg via INTRAVENOUS
  Filled 2021-08-17: qty 10

## 2021-08-17 MED ORDER — SODIUM CHLORIDE 0.9 % IV SOLN
1.0000 g | Freq: Once | INTRAVENOUS | Status: AC
  Administered 2021-08-17: 1 g via INTRAVENOUS
  Filled 2021-08-17: qty 10

## 2021-08-17 MED ORDER — POTASSIUM CHLORIDE CRYS ER 20 MEQ PO TBCR
20.0000 meq | EXTENDED_RELEASE_TABLET | Freq: Two times a day (BID) | ORAL | 0 refills | Status: AC
  Filled 2021-08-17: qty 6, 3d supply, fill #0

## 2021-08-17 MED ORDER — SODIUM CHLORIDE 0.9 % IV SOLN: INTRAVENOUS | Status: DC

## 2021-08-17 MED ORDER — POTASSIUM CHLORIDE 10 MEQ/100ML IV SOLN
10.0000 meq | INTRAVENOUS | Status: AC
  Administered 2021-08-17 (×2): 10 meq via INTRAVENOUS
  Filled 2021-08-17 (×2): qty 100

## 2021-08-17 MED ORDER — IOHEXOL 300 MG/ML  SOLN
100.0000 mL | Freq: Once | INTRAMUSCULAR | Status: AC | PRN
  Administered 2021-08-17: 100 mL via INTRAVENOUS

## 2021-08-17 NOTE — ED Notes (Signed)
Dr Earnest Conroy aware of potassium of 2.5 ?

## 2021-08-17 NOTE — Discharge Instructions (Addendum)
Take the Zofran to control the nausea and vomiting.  Take the potassium as directed for the next 3 days.  2 tablets a day.  Take the Keflex for the urinary tract infection.  Return for any persistent vomiting new or worse symptoms.  Or vomiting any pool of blood. ? ?Make appointment to follow-up with your regular doctor to have your potassium rechecked. ? ? ?

## 2021-08-17 NOTE — ED Triage Notes (Signed)
N/v x 2 days  states  had ETOH on sat  , has been sick since  LMP   April first ?

## 2021-08-17 NOTE — ED Provider Notes (Addendum)
?[facility] EMERGENCY DEPARTMENT ?Provider Note ? ? ?CSN: [id number] ?Arrival date & time: 08/17/21  0730 ? ?  ? ?History ? ?Chief Complaint  ?Patient presents with  ? Emesis  ? ? ?Madison French is a 26 y.o. female. ? ?Patient with the onset of nausea and vomiting on Saturday night.  Was drinking a lot of alcohol on Saturday patient is concerned she has alcohol poisoning.  Patient's last menstrual period was April 1.  Associated with some vomiting of specks of blood no diarrhea.  Also associated with some diffuse abdominal pain. ? 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  States she last had marijuana 2 days ago. ? ?Past medical history significant for asthma and eczema some food allergies.  Status post cesarean section in 2021. ? ? ?  ? ?Home Medications ?Prior to Admission medications   ?Medication Sig Start Date End Date Taking? Authorizing Provider  ?cephALEXin (KEFLEX) 500 MG capsule Take 1 capsule (500 mg total) by mouth 4 (four) times daily. 08/17/21  Yes Vanetta Mulders, MD  ?ondansetron (ZOFRAN-ODT) 4 MG disintegrating tablet Take 1 tablet (4 mg total) by mouth every 8 (eight) hours as needed. 08/17/21  Yes Vanetta Mulders, MD  ?potassium chloride SA (KLOR-CON M) 20 MEQ tablet Take 1 tablet (20 mEq total) by mouth 2 (two) times daily. 08/17/21  Yes Vanetta Mulders, MD  ?acetaminophen (TYLENOL) 325 MG tablet Take 2 tablets (650 mg total) by mouth every 6 (six) hours as needed. 03/05/21   Sloan Leiter, DO  ?albuterol (PROVENTIL HFA) 108 (90 Base) MCG/ACT inhaler 2 puffs every 4 hours if needed for wheezing or coughing spells 11/28/19   Bobbitt, Heywood Iles, MD  ?albuterol (PROVENTIL) (2.5 MG/3ML) 0.083% nebulizer solution Take 3 mLs (2.5 mg total) by nebulization every 4 (four) hours as needed for wheezing or shortness of breath. 11/17/17   Bobbitt, Heywood Iles,  MD  ?budesonide (PULMICORT FLEXHALER) 180 MCG/ACT inhaler 2 puffs twice daily to prevent coughing or wheezing 11/28/19   Bobbitt, Heywood Iles, MD  ?budesonide (PULMICORT) 0.5 MG/2ML nebulizer solution Take 2 mLs (0.5 mg total) by nebulization 2 (two) times daily. 11/28/19   Bobbitt, Heywood Iles, MD  ?budesonide (RHINOCORT AQUA) 32 MCG/ACT nasal spray 1 spray twice daily as needed for stuffy nose. 11/28/19   Bobbitt, Heywood Iles, MD  ?Lennox Solders (EUCRISA) 2 % OINT Apply 1 application topically 2 (two) times daily as needed (to red itchy areas). ?Patient not taking: Reported on 11/28/2019 11/23/18   Bobbitt, Heywood Iles, MD  ?Lennox Solders [facility]) 2 % OINT Apply  Twice daily to affected areas as needed. 11/28/19   Bobbitt, Heywood Iles, MD  ?EPINEPHrine (EPIPEN 2-PAK) 0.3 mg/0.3 mL IJ SOAJ injection Use as directed for severe allergic reaction. 11/28/19   Bobbitt, Heywood Iles, MD  ?famotidine (PEPCID) 20 MG tablet Take 1 tablet (20 mg total) by mouth 2 (two) times daily. ?Patient not taking: Reported on 11/28/2019 09/16/19   Sharyon Cable, CNM  ?fluticasone (FLONASE) 50 MCG/ACT nasal spray 2 sprays per nostril once a day for stuffy nose 11/17/17   Bobbitt, Heywood Iles, MD  ?guaiFENesin (ROBITUSSIN) 100 MG/5ML liquid Take 5 mLs by mouth every 4 (four) hours as needed for cough or to loosen phlegm. 03/05/21   Tanda Rockers A, DO  ?ipratropium (ATROVENT) 0.06 % nasal spray Two sprays each nostril three times a  day as needed for runny nose. ?Patient not taking: Reported on 11/23/2018 11/17/17   Bobbitt, Heywood Ilesalph Carter, MD  ?loratadine (CLARITIN) 10 MG tablet Take 1 tablet (10 mg total) by mouth daily as needed for allergies, rhinitis or itching. 11/17/17   Bobbitt, Heywood Ilesalph Carter, MD  ?Olopatadine HCl (PAZEO) 0.7 % SOLN Place 1 drop into both eyes 1 day or 1 dose. 11/17/17   Bobbitt, Heywood Ilesalph Carter, MD  ?ondansetron (ZOFRAN ODT) 4 MG disintegrating tablet Take 1 tablet (4 mg total) by mouth every 8 (eight) hours as needed for nausea or  vomiting. 09/16/19   Sharyon Cableogers, Veronica C, CNM  ?ondansetron (ZOFRAN ODT) 4 MG disintegrating tablet Take 1 tablet (4 mg total) by mouth every 8 (eight) hours as needed for nausea or vomiting. 09/11/20   Tegeler, Canary Brimhristopher J, MD  ?potassium chloride SA (KLOR-CON) 20 MEQ tablet Take 1 tablet (20 mEq total) by mouth 2 (two) times daily for 5 days. 09/17/19 09/22/19  Pollyann SavoySheldon, Charles B, MD  ?prochlorperazine (COMPAZINE) 10 MG tablet Take 1 tablet (10 mg total) by mouth 2 (two) times daily as needed for nausea or vomiting. 09/17/19   Pollyann SavoySheldon, Charles B, MD  ?promethazine (PHENERGAN) 25 MG suppository Place 1 suppository (25 mg total) rectally every 6 (six) hours as needed for nausea or vomiting. 10/07/20   Tanda RockersVenter, Margaux, PA-C  ?promethazine (PHENERGAN) 25 MG tablet Take 1 tablet (25 mg total) by mouth every 6 (six) hours as needed for nausea or vomiting. 09/19/19   Gailen ShelterFondaw, Wylder S, PA  ?   ? ?Allergies    ?Cantaloupe extract allergy skin test, Citrullus vulgaris, Peanut-containing drug products, Sesame seed (diagnostic), Watermelon flavor, and Other   ? ?Review of Systems   ?Review of Systems  ?Constitutional:  Negative for chills and fever.  ?HENT:  Negative for ear pain and sore throat.   ?Eyes:  Negative for pain and visual disturbance.  ?Respiratory:  Negative for cough and shortness of breath.   ?Cardiovascular:  Negative for chest pain and palpitations.  ?Gastrointestinal:  Positive for abdominal pain, nausea and vomiting. Negative for diarrhea.  ?Genitourinary:  Negative for dysuria and hematuria.  ?Musculoskeletal:  Negative for arthralgias and back pain.  ?Skin:  Negative for color change and rash.  ?Neurological:  Negative for seizures and syncope.  ?All other systems reviewed and are negative. ? ?Physical Exam ?Updated Vital Signs ?BP 93/67   Pulse 69   Temp 97.6 ?F (36.4 ?C) (Oral)   Resp 16   Ht 1.753 m (5\' 9" )   Wt 77.1 kg   LMP 07/11/2021 Comment: neg upreg in er today.  SpO2 98%   BMI 25.10 kg/m?   ?Physical Exam ?Vitals and nursing note reviewed.  ?Constitutional:   ?   General: She is not in acute distress. ?   Appearance: Normal appearance. She is well-developed.  ?HENT:  ?   Head: Normocephalic and atraumatic.  ?   Mouth/Throat:  ?   Mouth: Mucous membranes are dry.  ?Eyes:  ?   Conjunctiva/sclera: Conjunctivae normal.  ?Cardiovascular:  ?   Rate and Rhythm: Normal rate and regular rhythm.  ?   Heart sounds: No murmur heard. ?Pulmonary:  ?   Effort: Pulmonary effort is normal. No respiratory distress.  ?   Breath sounds: Normal breath sounds.  ?Abdominal:  ?   General: There is no distension.  ?   Palpations: Abdomen is soft.  ?   Tenderness: There is no abdominal tenderness. There is no guarding.  ?Musculoskeletal:     ?  General: No swelling.  ?   Cervical back: Normal range of motion and neck supple.  ?Skin: ?   General: Skin is warm and dry.  ?   Capillary Refill: Capillary refill takes less than 2 seconds.  ?Neurological:  ?   General: No focal deficit present.  ?   Mental Status: She is alert and oriented to person, place, and time.  ?Psychiatric:     ?   Mood and Affect: Mood normal.  ? ? ?ED Results / Procedures / Treatments   ?Labs ?(all labs ordered are listed, but only abnormal results are displayed) ?Labs Reviewed  ?COMPREHENSIVE METABOLIC PANEL - Abnormal; Notable for the following components:  ?    Result Value  ? Potassium 2.5 (*)   ? CO2 21 (*)   ? Glucose, Bld 122 (*)   ? Total Protein 8.9 (*)   ? All other components within normal limits  ?CBC - Abnormal; Notable for the following components:  ? WBC 21.7 (*)   ? Platelets 556 (*)   ? All other components within normal limits  ?URINALYSIS, ROUTINE W REFLEX MICROSCOPIC - Abnormal; Notable for the following components:  ? Color, Urine AMBER (*)   ? APPearance CLOUDY (*)   ? Hgb urine dipstick LARGE (*)   ? Ketones, ur 40 (*)   ? Protein, ur >=300 (*)   ? Leukocytes,Ua SMALL (*)   ? All other components within normal limits  ?URINALYSIS,  MICROSCOPIC (REFLEX) - Abnormal; Notable for the following components:  ? Bacteria, UA MANY (*)   ? All other components within normal limits  ?URINE CULTURE  ?LIPASE, BLOOD  ?PREGNANCY, URINE  ? ? ?EKG ?None ? ?Radiol

## 2021-08-20 LAB — URINE CULTURE: Culture: >=100000 — AB

## 2021-08-21 ENCOUNTER — Telehealth: Payer: Self-pay

## 2021-08-21 NOTE — Telephone Encounter (Signed)
Post ED Visit - Positive Culture Follow-up ? ?Culture report reviewed by antimicrobial stewardship pharmacist: ?[facility] ?[x]  , Sula Soda.D. ?[]  [address], Pharm.D., BCPS AQ-ID ?[]  , Pharm.D., BCPS ?[]  Celedonio Miyamoto, Pharm.D., BCPS ?[]  [place], Garvin Fila.D., BCPS, AAHIVP ?[]  , Pharm.D., BCPS, AAHIVP ?[]  Georgina Pillion, PharmD, BCPS ?[]  , PharmD, BCPS ?[]  [place], PharmD, BCPS ?[]  [address], PharmD ?[]  , PharmD, BCPS ?[]  Estella Husk, PharmD ? ? [facility] ?[]  Lysle Pearl, PharmD ?[]  , PharmD ?[]  Phillips Climes, PharmD ?[]  , Rph ?[]  Agapito Games) , PharmD ?[]  Verlan Friends, PharmD ?[]  , PharmD ?[]  Mervyn Gay, PharmD ?[]  , PharmD ?[]  Vinnie Level, PharmD ?[]  Gerri Spore, PharmD ?[]  , PharmD ?[]  Len Childs, PharmD ? ? ?Positive urine culture ?Treated with Cephalexin, organism sensitive to the same and no further patient follow-up is required at this time. ? ? ?08/21/2021, 10:09 AM ?  ?

## 2021-10-16 ENCOUNTER — Encounter (HOSPITAL_BASED_OUTPATIENT_CLINIC_OR_DEPARTMENT_OTHER): Payer: Self-pay | Admitting: Emergency Medicine

## 2021-10-16 ENCOUNTER — Emergency Department (HOSPITAL_BASED_OUTPATIENT_CLINIC_OR_DEPARTMENT_OTHER)
Admission: EM | Admit: 2021-10-16 | Discharge: 2021-10-16 | Disposition: A | Discharge status: Home / Self Care | Payer: Medicaid Other | Attending: Emergency Medicine | Admitting: Emergency Medicine

## 2021-10-16 ENCOUNTER — Other Ambulatory Visit: Payer: Self-pay

## 2021-10-16 DIAGNOSIS — R112 Nausea with vomiting, unspecified: Secondary | ICD-10-CM | POA: Insufficient documentation

## 2021-10-16 DIAGNOSIS — Z91010 Allergy to peanuts: Secondary | ICD-10-CM | POA: Diagnosis not present

## 2021-10-16 DIAGNOSIS — D72829 Elevated white blood cell count, unspecified: Secondary | ICD-10-CM | POA: Insufficient documentation

## 2021-10-16 DIAGNOSIS — Z79899 Other long term (current) drug therapy: Secondary | ICD-10-CM | POA: Diagnosis not present

## 2021-10-16 DIAGNOSIS — E860 Dehydration: Secondary | ICD-10-CM | POA: Insufficient documentation

## 2021-10-16 LAB — COMPREHENSIVE METABOLIC PANEL
ALT: 17 U/L (ref 0–44)
AST: 28 U/L (ref 15–41)
Albumin: 4.8 g/dL (ref 3.5–5.0)
Alkaline Phosphatase: 57 U/L (ref 38–126)
Anion gap: 14 (ref 5–15)
BUN: 8 mg/dL (ref 6–20)
CO2: 20 mmol/L — ABNORMAL LOW (ref 22–32)
Calcium: 9.9 mg/dL (ref 8.9–10.3)
Chloride: 105 mmol/L (ref 98–111)
Creatinine, Ser: 0.72 mg/dL (ref 0.44–1.00)
GFR, Estimated: >60 mL/min (ref >60)
Glucose, Bld: 132 mg/dL — ABNORMAL HIGH (ref 70–99)
Potassium: 3.3 mmol/L — ABNORMAL LOW (ref 3.5–5.1)
Sodium: 139 mmol/L (ref 135–145)
Total Bilirubin: 0.6 mg/dL (ref 0.3–1.2)
Total Protein: 8.8 g/dL — ABNORMAL HIGH (ref 6.5–8.1)

## 2021-10-16 LAB — URINALYSIS, ROUTINE W REFLEX MICROSCOPIC
Bilirubin Urine: NEGATIVE
Glucose, UA: NEGATIVE mg/dL
Ketones, ur: >=80 mg/dL — AB
Leukocytes,Ua: NEGATIVE
Nitrite: POSITIVE — AB
Protein, ur: 30 mg/dL — AB
Specific Gravity, Urine: 1.02 (ref 1.005–1.030)
pH: 7.5 (ref 5.0–8.0)

## 2021-10-16 LAB — CBC WITH DIFFERENTIAL/PLATELET
Abs Immature Granulocytes: 0.2 10*3/uL — ABNORMAL HIGH (ref 0.00–0.07)
Basophils Absolute: 0.1 10*3/uL (ref 0.0–0.1)
Basophils Relative: 0 %
Eosinophils Absolute: 0 10*3/uL (ref 0.0–0.5)
Eosinophils Relative: 0 %
HCT: 39.7 % (ref 36.0–46.0)
Hemoglobin: 13.9 g/dL (ref 12.0–15.0)
Immature Granulocytes: 1 %
Lymphocytes Relative: 7 %
Lymphs Abs: 1.6 10*3/uL (ref 0.7–4.0)
MCH: 32 pg (ref 26.0–34.0)
MCHC: 35 g/dL (ref 30.0–36.0)
MCV: 91.5 fL (ref 80.0–100.0)
Monocytes Absolute: 0.8 10*3/uL (ref 0.1–1.0)
Monocytes Relative: 3 %
Neutro Abs: 21.4 10*3/uL — ABNORMAL HIGH (ref 1.7–7.7)
Neutrophils Relative %: 89 %
Platelets: 471 10*3/uL — ABNORMAL HIGH (ref 150–400)
RBC: 4.34 MIL/uL (ref 3.87–5.11)
RDW: 12.5 % (ref 11.5–15.5)
WBC: 24 10*3/uL — ABNORMAL HIGH (ref 4.0–10.5)
nRBC: 0 % (ref 0.0–0.2)

## 2021-10-16 LAB — URINALYSIS, MICROSCOPIC (REFLEX)

## 2021-10-16 LAB — ETHANOL: Alcohol, Ethyl (B): <10 mg/dL (ref <10)

## 2021-10-16 LAB — LIPASE, BLOOD: Lipase: 28 U/L (ref 11–51)

## 2021-10-16 LAB — PREGNANCY, URINE: Preg Test, Ur: NEGATIVE

## 2021-10-16 MED ORDER — HALOPERIDOL LACTATE 5 MG/ML IJ SOLN
2.5000 mg | Freq: Once | INTRAMUSCULAR | Status: AC
  Administered 2021-10-16: 2.5 mg via INTRAVENOUS
  Filled 2021-10-16: qty 1

## 2021-10-16 MED ORDER — PROCHLORPERAZINE MALEATE 10 MG PO TABS: 10.0000 mg | ORAL_TABLET | Freq: Two times a day (BID) | ORAL | 0 refills | Status: AC | PRN

## 2021-10-16 MED ORDER — PROCHLORPERAZINE EDISYLATE 10 MG/2ML IJ SOLN
10.0000 mg | Freq: Once | INTRAMUSCULAR | Status: AC
Start: 2021-10-16 — End: 2021-10-16
  Administered 2021-10-16: 10 mg via INTRAVENOUS
  Filled 2021-10-16: qty 2

## 2021-10-16 MED ORDER — LACTATED RINGERS IV BOLUS
1000.0000 mL | Freq: Once | INTRAVENOUS | Status: AC
  Administered 2021-10-16: 1000 mL via INTRAVENOUS

## 2021-10-16 MED ORDER — SODIUM CHLORIDE 0.9 % IV BOLUS
1000.0000 mL | Freq: Once | INTRAVENOUS | Status: AC
Start: 2021-10-16 — End: 2021-10-16
  Administered 2021-10-16: 1000 mL via INTRAVENOUS

## 2021-10-16 MED ORDER — ONDANSETRON 4 MG PO TBDP
4.0000 mg | ORAL_TABLET | Freq: Once | ORAL | Status: AC | PRN
  Administered 2021-10-16: 4 mg via ORAL
  Filled 2021-10-16: qty 1

## 2021-10-16 MED ORDER — ONDANSETRON HCL 4 MG/2ML IJ SOLN
INTRAMUSCULAR | Status: AC
  Administered 2021-10-16: 4 mg
  Filled 2021-10-16: qty 2

## 2021-10-16 NOTE — ED Triage Notes (Signed)
Pt reports heavy drinking last night, now having generalized abdominal pain and n/v. Pt concerned for alcohol poisoning. Denies drug use.

## 2021-10-16 NOTE — ED Notes (Signed)
Patient tolerating PO challenge well.  

## 2021-10-16 NOTE — ED Notes (Signed)
Blue top, Dk grn, lt grn and lav labs drawn

## 2021-10-16 NOTE — Discharge Instructions (Addendum)
A urine culture has been sent and you will be notified if it is positive.

## 2021-10-16 NOTE — ED Notes (Signed)
Visitor at bedside.

## 2021-10-16 NOTE — ED Provider Notes (Signed)
[facility] EMERGENCY DEPARTMENT Provider Note   CSN: [id number] Arrival date & time: 10/16/21  1427     History  Chief Complaint  Patient presents with   Emesis    Madison French is a 26 y.o. female.   Emesis Patient and vomiting.  Began earlier today.  Consistent patient had a last night.  States she was drinking alcohol has not episodes like this experience in the past.  No diarrhea.  Does have diffuse abdominal pain.  No known sick contacts.  Occasional marijuana use.  Review of notes appears that she has had previous admissions and ER secondary to alcohol and marijuana     Home Medications Prior to Admission medications   Medication Sig Start Date End Date Taking? Authorizing Provider  acetaminophen (TYLENOL) 325 MG tablet Take 2 tablets (650 mg total) by mouth every 6 (six) hours as needed. 03/05/21   Sloan Leiter, DO  albuterol (PROVENTIL HFA) [phone] Base) MCG/ACT inhaler 2 puffs every 4 hours if needed for wheezing or coughing spells 11/28/19   Bobbitt, Heywood Iles, MD  albuterol (PROVENTIL) (2.5 MG/3ML) 0.083% nebulizer solution Take 3 mLs (2.5 mg total) by nebulization every 4 (four) hours as needed for wheezing or shortness of breath. 11/17/17   Bobbitt, Heywood Iles, MD  budesonide (PULMICORT Linzie Collin) 180 MCG/ACT inhaler 2 puffs twice daily to prevent coughing or wheezing 11/28/19   Bobbitt, Heywood Iles, MD  budesonide (PULMICORT) 0.5 MG/2ML nebulizer solution Take 2 mLs (0.5 mg total) by nebulization 2 (two) times daily. 11/28/19   Bobbitt, Heywood Iles, MD  budesonide (RHINOCORT AQUA) 32 MCG/ACT nasal spray 1 spray twice daily as needed for stuffy nose. 11/28/19   Bobbitt, Heywood Iles, MD  cephALEXin (KEFLEX) 500 MG capsule Take 1 capsule (500 mg total) by mouth 4 (four) times daily. 08/17/21   Vanetta Mulders, MD  Crisaborole (EUCRISA) 2 % OINT Apply 1 application topically 2 (two) times daily as needed (to red itchy areas). Patient not taking: Reported  on 11/28/2019 11/23/18   Bobbitt, Heywood Iles, MD  Crisaborole (EUCRISA) 2 % OINT Apply  Twice daily to affected areas as needed. 11/28/19   Bobbitt, Heywood Iles, MD  EPINEPHrine (EPIPEN 2-PAK) 0.3 mg/0.3 mL IJ SOAJ injection Use as directed for severe allergic reaction. 11/28/19   Bobbitt, Heywood Iles, MD  famotidine (PEPCID) 20 MG tablet Take 1 tablet (20 mg total) by mouth 2 (two) times daily. Patient not taking: Reported on 11/28/2019 09/16/19   Sharyon Cable, CNM  fluticasone [facility]) 50 MCG/ACT nasal spray 2 sprays per nostril once a day for stuffy nose 11/17/17   Bobbitt, Heywood Iles, MD  guaiFENesin (ROBITUSSIN) 100 MG/5ML liquid Take 5 mLs by mouth every 4 (four) hours as needed for cough or to loosen phlegm. 03/05/21   Tanda Rockers A, DO  ipratropium (ATROVENT) 0.06 % nasal spray Two sprays each nostril three times a day as needed for runny nose. Patient not taking: Reported on 11/23/2018 11/17/17   Bobbitt, Heywood Iles, MD  loratadine (CLARITIN) 10 MG tablet Take 1 tablet (10 mg total) by mouth daily as needed for allergies, rhinitis or itching. 11/17/17   Bobbitt, Heywood Iles, MD  Olopatadine HCl (PAZEO) 0.7 % SOLN Place 1 drop into both eyes 1 day or 1 dose. 11/17/17   Bobbitt, Heywood Iles, MD  ondansetron (ZOFRAN ODT) 4 MG disintegrating tablet Take 1 tablet (4 mg total) by mouth every 8 (eight) hours as needed for nausea or vomiting. 09/16/19  Sharyon Cable, CNM  ondansetron (ZOFRAN ODT) 4 MG disintegrating tablet Take 1 tablet (4 mg total) by mouth every 8 (eight) hours as needed for nausea or vomiting. 09/11/20   Tegeler, Canary Brim, MD  ondansetron (ZOFRAN-ODT) 4 MG disintegrating tablet Take 1 tablet (4 mg total) by mouth every 8 (eight) hours as needed. 08/17/21   Vanetta Mulders, MD  potassium chloride SA (KLOR-CON M) 20 MEQ tablet Take 1 tablet (20 mEq total) by mouth 2 (two) times daily. 08/17/21   Vanetta Mulders, MD  potassium chloride SA (KLOR-CON) 20 MEQ tablet Take 1 tablet  (20 mEq total) by mouth 2 (two) times daily for 5 days. 09/17/19 09/22/19  Pollyann Savoy, MD  prochlorperazine (COMPAZINE) 10 MG tablet Take 1 tablet (10 mg total) by mouth 2 (two) times daily as needed for nausea or vomiting. 10/16/21   Benjiman Core, MD  promethazine (PHENERGAN) 25 MG suppository Place 1 suppository (25 mg total) rectally every 6 (six) hours as needed for nausea or vomiting. 10/07/20   Tanda Rockers, PA-C  promethazine (PHENERGAN) 25 MG tablet Take 1 tablet (25 mg total) by mouth every 6 (six) hours as needed for nausea or vomiting. 09/19/19   Gailen Shelter, PA      Allergies    Cantaloupe extract allergy skin test, Citrullus vulgaris, Peanut-containing drug products, Sesame seed (diagnostic), Watermelon flavor, and Other    Review of Systems   Review of Systems  Gastrointestinal:  Positive for vomiting.    Physical Exam Updated Vital Signs BP 116/81   Pulse 70   Temp 98.3 F (36.8 C)   Resp 16   Wt 72.6 kg   LMP 09/27/2021 (Approximate)   SpO2 100%   BMI 23.63 kg/m  Physical Exam Vitals and nursing note reviewed.  HENT:     Head: Atraumatic.  Cardiovascular:     Rate and Rhythm: Regular rhythm.  Abdominal:     Tenderness: There is abdominal tenderness.     Comments: Mild diffuse abdominal tenderness without rebound or guarding.  No hernias palpated.  Neurological:     Mental Status: She is alert.     ED Results / Procedures / Treatments   Labs (all labs ordered are listed, but only abnormal results are displayed) Labs Reviewed  COMPREHENSIVE METABOLIC PANEL - Abnormal; Notable for the following components:      Result Value   Potassium 3.3 (*)    CO2 20 (*)    Glucose, Bld 132 (*)    Total Protein 8.8 (*)    All other components within normal limits  URINALYSIS, ROUTINE W REFLEX MICROSCOPIC - Abnormal; Notable for the following components:   APPearance CLOUDY (*)    Hgb urine dipstick TRACE (*)    Ketones, ur >=80 (*)    Protein, ur 30  (*)    Nitrite POSITIVE (*)    All other components within normal limits  CBC WITH DIFFERENTIAL/PLATELET - Abnormal; Notable for the following components:   WBC 24.0 (*)    Platelets 471 (*)    Neutro Abs 21.4 (*)    Abs Immature Granulocytes 0.20 (*)    All other components within normal limits  URINALYSIS, MICROSCOPIC (REFLEX) - Abnormal; Notable for the following components:   Bacteria, UA MANY (*)    All other components within normal limits  URINE CULTURE  LIPASE, BLOOD  PREGNANCY, URINE  ETHANOL    EKG None  Radiology No results found.  Procedures Procedures    Medications Ordered  in ED Medications  ondansetron (ZOFRAN-ODT) disintegrating tablet 4 mg (4 mg Oral Given 10/16/21 1438)  ondansetron (ZOFRAN) 4 MG/2ML injection (4 mg  Given 10/16/21 1514)  sodium chloride 0.9 % bolus 1,000 mL (0 mLs Intravenous Stopped 10/16/21 1626)  prochlorperazine (COMPAZINE) injection 10 mg (10 mg Intravenous Given 10/16/21 1532)  lactated ringers bolus 1,000 mL (0 mLs Intravenous Stopped 10/16/21 1638)  lactated ringers bolus 1,000 mL (0 mLs Intravenous Stopped 10/16/21 2045)  haloperidol lactate (HALDOL) injection 2.5 mg (2.5 mg Intravenous Given 10/16/21 1937)    ED Course/ Medical Decision Making/ A&P                           Medical Decision Making Amount and/or Complexity of Data Reviewed Labs: ordered.  Risk Prescription drug management.   Patient presents with nausea vomiting some abdominal pain.  History of same.  States she thinks she just drank too much last night.  States when she drinks alcohol this will happen.  Lab work reviewed and showed mild hypokalemia.  However does have a leukocytosis.  White count of 24.  Reviewing notes it appears that when she has had episodes of the nausea and vomiting she is gone up near this level.  Not having fevers.  Rather benign abdominal exam.  Urine does have bacteria and is nitrite positive.  However not having dysuria.  Cultures been sent.   Will not empirically treat.  Patient feeling somewhat better as tolerated some Gatorade.  Urine does have ketones in it.  I have reviewed previous notes.  Do not think she require admission to the hospital.  Will discharge with antiemetics.  States she is out of her antiemetics at home.  States she will be stopping drinking.  If urine culture came back positive I would treat due to the leukocytosis and vomiting.        Final Clinical Impression(s) / ED Diagnoses Final diagnoses:  Nausea and vomiting, unspecified vomiting type  Dehydration  Leukocytosis, unspecified type    Rx / DC Orders ED Discharge Orders          Ordered    prochlorperazine (COMPAZINE) 10 MG tablet  2 times daily PRN        10/16/21 2131              Benjiman Core, MD 10/16/21 2135

## 2021-10-16 NOTE — ED Notes (Signed)
Appears to be resting with eyes closed, no further retching, vomiting noted since last medication adminstration

## 2021-10-16 NOTE — ED Notes (Addendum)
Patient given Gatorade for PO challenge.  Patient sleeping upon arrival to room.  Patient woken up and reminded to drink as she could tolerate.

## 2021-10-16 NOTE — ED Notes (Signed)
Awoke this am with severe abd pain and nausea and vomiting, presented to ED with active vomiting, placed in exam room, IV established, IVF NS bolus initiated, 4mg  Zofran per protocol administered, VS reassessed.

## 2021-10-16 NOTE — ED Notes (Signed)
Called lab for culture, lab is checking to see if the sample is enough to test.

## 2021-10-16 NOTE — ED Notes (Signed)
Client vomited PO Zofran, IV administered

## 2021-10-18 LAB — URINE CULTURE: Culture: 2000 — AB

## 2021-10-19 ENCOUNTER — Telehealth (HOSPITAL_BASED_OUTPATIENT_CLINIC_OR_DEPARTMENT_OTHER): Payer: Self-pay | Admitting: *Deleted

## 2021-10-19 NOTE — Progress Notes (Signed)
ED Antimicrobial Stewardship Positive Culture Follow Up   Madison French is an 26 y.o. female who presented to [facility] on 10/16/2021 with a chief complaint of  Chief Complaint  Patient presents with   Emesis    Recent Results (from the past 720 hour(s))  Urine Culture     Status: Abnormal   Collection Time: 10/16/21  9:06 PM   Specimen: Urine, Clean Catch  Result Value Ref Range Status   Specimen Description   Final    URINE, CLEAN CATCH Performed at [facility], [address] [place], [place] [address]    Special Requests   Final    NONE Performed at [facility], [address] [place], [place] [address]    Culture (A)  Final    2,000 COLONIES/mL GROUP B STREP(S.AGALACTIAE)ISOLATED TESTING AGAINST S. AGALACTIAE NOT ROUTINELY PERFORMED DUE TO PREDICTABILITY OF AMP/PEN/VAN SUSCEPTIBILITY. Performed at [facility], [address] [place], [place] [address]    Report Status 10/18/2021 FINAL  Final    [x]  Patient discharged originally without antimicrobial agent  New antibiotic prescription: No further treatment indicated for asymptomatic bacteriuria (and only 2K colonies of likely colonizer GBS in UCx).  ED Provider: , MD   Gloris Manchester Dohlen 10/19/2021, 7:44 AM Clinical Pharmacist Monday - Friday phone -  [phone] Saturday - Sunday phone - [phone]

## 2021-10-19 NOTE — Telephone Encounter (Signed)
Post ED Visit - Positive Culture Follow-up  Culture report reviewed by antimicrobial stewardship pharmacist: [facility] []  , Pharm.D. []  Enzo Bi, Pharm.D., BCPS AQ-ID []  , Pharm.D., BCPS []  Celedonio Miyamoto, Pharm.D., BCPS []  [place], Garvin Fila.D., BCPS, AAHIVP []  , Pharm.D., BCPS, AAHIVP []  Georgina Pillion, PharmD, BCPS []  , PharmD, BCPS []  [place], PharmD, BCPS []  [place], PharmD []  , PharmD, BCPS [x]  Estella Husk, PharmD  Pharmacy Team []  Lysle Pearl, PharmD []  , PharmD []  Phillips Climes, PharmD []  , Rph []  Agapito Games) , PharmD []  Verlan Friends, PharmD []  , PharmD []  Mervyn Gay, PharmD []  , PharmD []  Thornell Mule, PharmD []  Wonda Olds, PharmD []  , PharmD []  Len Childs, PharmD   Positive urine culture No further tx needed for asymptomatic bacteremia. Only 2k colonies GBS  per , MD  [facility] 10/19/2021, 12:08 PM

## 2022-02-03 ENCOUNTER — Other Ambulatory Visit: Payer: Self-pay

## 2022-02-03 ENCOUNTER — Emergency Department (HOSPITAL_BASED_OUTPATIENT_CLINIC_OR_DEPARTMENT_OTHER): Payer: Medicaid Other

## 2022-02-03 ENCOUNTER — Encounter (HOSPITAL_BASED_OUTPATIENT_CLINIC_OR_DEPARTMENT_OTHER): Payer: Self-pay | Admitting: Urology

## 2022-02-03 ENCOUNTER — Emergency Department (HOSPITAL_BASED_OUTPATIENT_CLINIC_OR_DEPARTMENT_OTHER)
Admission: EM | Admit: 2022-02-03 | Discharge: 2022-02-03 | Disposition: A | Discharge status: Home / Self Care | Payer: Medicaid Other | Attending: Emergency Medicine | Admitting: Emergency Medicine

## 2022-02-03 DIAGNOSIS — R1030 Lower abdominal pain, unspecified: Secondary | ICD-10-CM | POA: Insufficient documentation

## 2022-02-03 DIAGNOSIS — D72829 Elevated white blood cell count, unspecified: Secondary | ICD-10-CM | POA: Insufficient documentation

## 2022-02-03 DIAGNOSIS — Z91010 Allergy to peanuts: Secondary | ICD-10-CM | POA: Diagnosis not present

## 2022-02-03 DIAGNOSIS — N939 Abnormal uterine and vaginal bleeding, unspecified: Secondary | ICD-10-CM | POA: Insufficient documentation

## 2022-02-03 LAB — CBC WITH DIFFERENTIAL/PLATELET
Abs Immature Granulocytes: 0.02 10*3/uL (ref 0.00–0.07)
Basophils Absolute: 0 10*3/uL (ref 0.0–0.1)
Basophils Relative: 0 %
Eosinophils Absolute: 0.2 10*3/uL (ref 0.0–0.5)
Eosinophils Relative: 2 %
HCT: 35.4 % — ABNORMAL LOW (ref 36.0–46.0)
Hemoglobin: 12 g/dL (ref 12.0–15.0)
Immature Granulocytes: 0 %
Lymphocytes Relative: 21 %
Lymphs Abs: 2.4 10*3/uL (ref 0.7–4.0)
MCH: 31.7 pg (ref 26.0–34.0)
MCHC: 33.9 g/dL (ref 30.0–36.0)
MCV: 93.7 fL (ref 80.0–100.0)
Monocytes Absolute: 0.7 10*3/uL (ref 0.1–1.0)
Monocytes Relative: 6 %
Neutro Abs: 8.1 10*3/uL — ABNORMAL HIGH (ref 1.7–7.7)
Neutrophils Relative %: 71 %
Platelets: 310 10*3/uL (ref 150–400)
RBC: 3.78 MIL/uL — ABNORMAL LOW (ref 3.87–5.11)
RDW: 12.4 % (ref 11.5–15.5)
WBC: 11.5 10*3/uL — ABNORMAL HIGH (ref 4.0–10.5)
nRBC: 0 % (ref 0.0–0.2)

## 2022-02-03 LAB — URINALYSIS, MICROSCOPIC (REFLEX): RBC / HPF: >50 RBC/hpf (ref 0–5)

## 2022-02-03 LAB — URINALYSIS, ROUTINE W REFLEX MICROSCOPIC

## 2022-02-03 LAB — COMPREHENSIVE METABOLIC PANEL
ALT: 13 U/L (ref 0–44)
AST: 14 U/L — ABNORMAL LOW (ref 15–41)
Albumin: 3.8 g/dL (ref 3.5–5.0)
Alkaline Phosphatase: 43 U/L (ref 38–126)
Anion gap: 6 (ref 5–15)
BUN: 9 mg/dL (ref 6–20)
CO2: 26 mmol/L (ref 22–32)
Calcium: 9.1 mg/dL (ref 8.9–10.3)
Chloride: 106 mmol/L (ref 98–111)
Creatinine, Ser: 0.65 mg/dL (ref 0.44–1.00)
GFR, Estimated: >60 mL/min (ref >60)
Glucose, Bld: 85 mg/dL (ref 70–99)
Potassium: 3.4 mmol/L — ABNORMAL LOW (ref 3.5–5.1)
Sodium: 138 mmol/L (ref 135–145)
Total Bilirubin: 0.4 mg/dL (ref 0.3–1.2)
Total Protein: 6.8 g/dL (ref 6.5–8.1)

## 2022-02-03 LAB — WET PREP, GENITAL
Clue Cells Wet Prep HPF POC: NONE SEEN
Sperm: NONE SEEN
Trich, Wet Prep: NONE SEEN
WBC, Wet Prep HPF POC: <10 (ref <10)
Yeast Wet Prep HPF POC: NONE SEEN

## 2022-02-03 LAB — PREGNANCY, URINE: Preg Test, Ur: NEGATIVE

## 2022-02-03 LAB — LIPASE, BLOOD: Lipase: 43 U/L (ref 11–51)

## 2022-02-03 NOTE — Discharge Instructions (Signed)
Your work-up in the emergency department has been reassuring.  You may follow-up with an OB/GYN if bleeding persists or becomes concerning.  Return to the ED if you require the use of more than 1 pad per hour for at least 5 consecutive hours or develop lightheadedness, passing out, fever over 100.60F.

## 2022-02-03 NOTE — ED Provider Notes (Signed)
[facility] EMERGENCY DEPARTMENT Provider Note   CSN: [id number] Arrival date & time: 02/03/22  1650     History  Chief Complaint  Patient presents with   Vaginal Bleeding    Madison French is a 25 y.o. female who presents emergency department concerns for vaginal spotting onset this morning.  Notes that she had blood clots as well this morning.  Notes that her last menstrual cycle was in August she does not have regular menstrual cycles due to being intermittently on and off birth control.  Noted bilateral lower abdominal pain this morning associated with blood clots.  No meds tried prior to arrival.  She does have an OB/GYN but she is in the market looking for a new OB/GYN.  Denies nausea, vomiting, dysuria, fever, vaginal discharge.  Patient is currently sexually active with 1 female partner with unprotected intercourse, however denies concerns at this time for STDs.  Per patient chart review: Patient was evaluated at [facility] on 12/16/2021 for similar concerns.  At that time patient was informed to be having a miscarriage due to her taking Cytotec prior to arrival.   The history is provided by the patient. No language interpreter was used.       Home Medications Prior to Admission medications   Medication Sig Start Date End Date Taking? Authorizing Provider  acetaminophen (TYLENOL) 325 MG tablet Take 2 tablets (650 mg total) by mouth every 6 (six) hours as needed. 03/05/21   Sloan Leiter, DO  albuterol (PROVENTIL HFA) [phone] Base) MCG/ACT inhaler 2 puffs every 4 hours if needed for wheezing or coughing spells 11/28/19   Bobbitt, Heywood Iles, MD  albuterol (PROVENTIL) (2.5 MG/3ML) 0.083% nebulizer solution Take 3 mLs (2.5 mg total) by nebulization every 4 (four) hours as needed for wheezing or shortness of breath. 11/17/17   Bobbitt, Heywood Iles, MD  budesonide (PULMICORT Linzie Collin) 180 MCG/ACT inhaler 2 puffs twice daily to prevent coughing or wheezing  11/28/19   Bobbitt, Heywood Iles, MD  budesonide (PULMICORT) 0.5 MG/2ML nebulizer solution Take 2 mLs (0.5 mg total) by nebulization 2 (two) times daily. 11/28/19   Bobbitt, Heywood Iles, MD  budesonide (RHINOCORT AQUA) 32 MCG/ACT nasal spray 1 spray twice daily as needed for stuffy nose. 11/28/19   Bobbitt, Heywood Iles, MD  cephALEXin (KEFLEX) 500 MG capsule Take 1 capsule (500 mg total) by mouth 4 (four) times daily. 08/17/21   Vanetta Mulders, MD  Crisaborole (EUCRISA) 2 % OINT Apply 1 application topically 2 (two) times daily as needed (to red itchy areas). Patient not taking: Reported on 11/28/2019 11/23/18   Bobbitt, Heywood Iles, MD  Crisaborole (EUCRISA) 2 % OINT Apply  Twice daily to affected areas as needed. 11/28/19   Bobbitt, Heywood Iles, MD  EPINEPHrine (EPIPEN 2-PAK) 0.3 mg/0.3 mL IJ SOAJ injection Use as directed for severe allergic reaction. 11/28/19   Bobbitt, Heywood Iles, MD  famotidine (PEPCID) 20 MG tablet Take 1 tablet (20 mg total) by mouth 2 (two) times daily. Patient not taking: Reported on 11/28/2019 09/16/19   Sharyon Cable, CNM  fluticasone [facility]) 50 MCG/ACT nasal spray 2 sprays per nostril once a day for stuffy nose 11/17/17   Bobbitt, Heywood Iles, MD  guaiFENesin (ROBITUSSIN) 100 MG/5ML liquid Take 5 mLs by mouth every 4 (four) hours as needed for cough or to loosen phlegm. 03/05/21   Tanda Rockers A, DO  ipratropium (ATROVENT) 0.06 % nasal spray Two sprays each nostril three times a day as needed  for runny nose. Patient not taking: Reported on 11/23/2018 11/17/17   Bobbitt, Heywood Iles, MD  loratadine (CLARITIN) 10 MG tablet Take 1 tablet (10 mg total) by mouth daily as needed for allergies, rhinitis or itching. 11/17/17   Bobbitt, Heywood Iles, MD  Olopatadine HCl (PAZEO) 0.7 % SOLN Place 1 drop into both eyes 1 day or 1 dose. 11/17/17   Bobbitt, Heywood Iles, MD  ondansetron (ZOFRAN ODT) 4 MG disintegrating tablet Take 1 tablet (4 mg total) by mouth every 8 (eight) hours as  needed for nausea or vomiting. 09/16/19   Sharyon Cable, CNM  ondansetron (ZOFRAN ODT) 4 MG disintegrating tablet Take 1 tablet (4 mg total) by mouth every 8 (eight) hours as needed for nausea or vomiting. 09/11/20   Tegeler, Canary Brim, MD  ondansetron (ZOFRAN-ODT) 4 MG disintegrating tablet Take 1 tablet (4 mg total) by mouth every 8 (eight) hours as needed. 08/17/21   Vanetta Mulders, MD  potassium chloride SA (KLOR-CON M) 20 MEQ tablet Take 1 tablet (20 mEq total) by mouth 2 (two) times daily. 08/17/21   Vanetta Mulders, MD  potassium chloride SA (KLOR-CON) 20 MEQ tablet Take 1 tablet (20 mEq total) by mouth 2 (two) times daily for 5 days. 09/17/19 09/22/19  Pollyann Savoy, MD  prochlorperazine (COMPAZINE) 10 MG tablet Take 1 tablet (10 mg total) by mouth 2 (two) times daily as needed for nausea or vomiting. 10/16/21   Benjiman Core, MD  promethazine (PHENERGAN) 25 MG suppository Place 1 suppository (25 mg total) rectally every 6 (six) hours as needed for nausea or vomiting. 10/07/20   Tanda Rockers, PA-C  promethazine (PHENERGAN) 25 MG tablet Take 1 tablet (25 mg total) by mouth every 6 (six) hours as needed for nausea or vomiting. 09/19/19   Gailen Shelter, PA      Allergies    Cantaloupe extract allergy skin test, Citrullus vulgaris, Peanut-containing drug products, Sesame seed (diagnostic), Watermelon flavor, and Other    Review of Systems   Review of Systems  Constitutional:  Negative for fever.  Gastrointestinal:  Positive for abdominal pain. Negative for nausea and vomiting.  Genitourinary:  Positive for vaginal bleeding. Negative for dysuria and vaginal discharge.  All other systems reviewed and are negative.   Physical Exam Updated Vital Signs BP 101/70 (BP Location: Left Arm)   Pulse 97   Temp 98.5 F (36.9 C) (Oral)   Resp 18   Ht 5\' 9"  (1.753 m)   Wt 74.8 kg   SpO2 97%   BMI 24.37 kg/m  Physical Exam Vitals and nursing note reviewed. Exam conducted with a  chaperone present.  Constitutional:      General: She is not in acute distress.    Appearance: Normal appearance. She is not ill-appearing, toxic-appearing or diaphoretic.  HENT:     Head: Normocephalic and atraumatic.     Right Ear: External ear normal.     Left Ear: External ear normal.  Eyes:     General: No scleral icterus.    Extraocular Movements: Extraocular movements intact.  Cardiovascular:     Rate and Rhythm: Normal rate and regular rhythm.     Pulses: Normal pulses.     Heart sounds: Normal heart sounds.  Pulmonary:     Effort: Pulmonary effort is normal. No respiratory distress.     Breath sounds: Normal breath sounds.  Abdominal:     General: Abdomen is flat. Bowel sounds are normal. There is no distension.  Palpations: Abdomen is soft. There is no mass.     Tenderness: There is no abdominal tenderness.     Hernia: There is no hernia in the left inguinal area or right inguinal area.  Genitourinary:    Pubic Area: No rash.      Labia:        Right: No rash, tenderness, lesion or injury.        Left: No rash, tenderness, lesion or injury.      Vagina: No signs of injury and foreign body. Bleeding present. No vaginal discharge, erythema or tenderness.     Cervix: Normal.     Uterus: Normal. Not deviated, not enlarged, not fixed and not tender.      Adnexa: Right adnexa normal and left adnexa normal.     Comments: RN chaperone present for GU exam, notable for vaginal bleeding without clots noted to the vaginal vault.  Cervical os is dilated at this time.  No products of conception noted on speculum exam. No adnexal TTP.  Musculoskeletal:        General: Normal range of motion.     Cervical back: Normal range of motion and neck supple.  Lymphadenopathy:     Lower Body: No right inguinal adenopathy. No left inguinal adenopathy.  Skin:    General: Skin is warm and dry.  Neurological:     Mental Status: She is alert.     ED Results / Procedures / Treatments    Labs (all labs ordered are listed, but only abnormal results are displayed) Labs Reviewed  URINALYSIS, ROUTINE W REFLEX MICROSCOPIC - Abnormal; Notable for the following components:      Result Value   Color, Urine RED (*)    APPearance TURBID (*)    Glucose, UA   (*)    Value: TEST NOT REPORTED DUE TO COLOR INTERFERENCE OF URINE PIGMENT   Hgb urine dipstick   (*)    Value: TEST NOT REPORTED DUE TO COLOR INTERFERENCE OF URINE PIGMENT   Bilirubin Urine   (*)    Value: TEST NOT REPORTED DUE TO COLOR INTERFERENCE OF URINE PIGMENT   Ketones, ur   (*)    Value: TEST NOT REPORTED DUE TO COLOR INTERFERENCE OF URINE PIGMENT   Protein, ur   (*)    Value: TEST NOT REPORTED DUE TO COLOR INTERFERENCE OF URINE PIGMENT   Nitrite   (*)    Value: TEST NOT REPORTED DUE TO COLOR INTERFERENCE OF URINE PIGMENT   Leukocytes,Ua   (*)    Value: TEST NOT REPORTED DUE TO COLOR INTERFERENCE OF URINE PIGMENT   All other components within normal limits  URINALYSIS, MICROSCOPIC (REFLEX) - Abnormal; Notable for the following components:   Bacteria, UA FEW (*)    All other components within normal limits  WET PREP, GENITAL  PREGNANCY, URINE  RPR  HIV ANTIBODY (ROUTINE TESTING W REFLEX)  COMPREHENSIVE METABOLIC PANEL  CBC WITH DIFFERENTIAL/PLATELET  LIPASE, BLOOD  GC/CHLAMYDIA PROBE AMP ([place]) NOT AT [facility]    EKG None  Radiology No results found.  Procedures Procedures    Medications Ordered in ED Medications - No data to display  ED Course/ Medical Decision Making/ A&P                           Medical Decision Making Amount and/or Complexity of Data Reviewed Labs: ordered. Radiology: ordered.   Patient is a [id number] presents to the ED with  concerns for vaginal bleeding onset this morning.  Notes that she has not had a menstrual cycle since August.  Patient notes that she had a miscarriage last month.  No OB/GYN care at this time.  Patient is sexually active with unprotected  intercourse.  Denies concerns for STIs at this time.  Patient afebrile.  On exam patient with no abdominal tenderness to palpation.  RN chaperone present for GU exam, notable for vaginal bleeding without clots noted to the vaginal vault.  Cervical os is dilated at this time.  No products of conception noted on speculum exam. No adnexal TTP. Otherwise no acute cardiovascular, respiratory exam findings.  Differential diagnosis includes ectopic pregnancy, miscarriage, abnormal uterine bleeding, implantation bleeding.     Additional history obtained:  External records from outside source obtained and reviewed including: Patient was evaluated at [facility] on 12/16/2021 for similar concerns.  At that time patient was informed to be having a miscarriage due to her taking Cytotec prior to arrival.  Labs:  I ordered, and personally interpreted labs.  The pertinent results include:   Negative pregnancy urine CBC with elevated WBC at 11.2 otherwise unremarkable CMP and lipase ordered with results pending at time of signout HIV, RPR, gonorrhea/chlamydia ordered with results pending at time of signout. Wet prep unremarkable. Negative pregnancy urine Urinalysis notable for blood (patient currently on the vaginal bleeding)  Imaging: I ordered imaging studies including pelvic ultrasound ordered with results pending at time of signout   Patient case discussed with Antonietta Breach, PA-C at sign-out. Plan at sign-out is pending [place], likely discharge home, however, plans may change as per oncoming team. Patient care transferred at sign out.    This chart was dictated using voice recognition software, Dragon. Despite the best efforts of this provider to proofread and correct errors, errors may still occur which can change documentation meaning.   Final Clinical Impression(s) / ED Diagnoses Final diagnoses:  Abnormal uterine bleeding (AUB)    Rx / DC Orders ED Discharge Orders     None          Latika Kronick A, PA-C 02/03/22 1859    Hayden Rasmussen, MD 02/04/22 1012

## 2022-02-03 NOTE — Consult Note (Signed)
   OB/GYN Telephone Consult  Madison French is a 26 y.o. [id number] presenting with vaginal bleeding and cramping. .   I was called for a consult regarding the care of this patient by [facility].    The provider had a clinical question regarding interpretation of the ultrasound.    The provider presented the following relevant clinical information and I performed a chart review on the patient and reviewed available documentation:  The patient had a medical termination with misoprostol through [facility] in the early part of September (9/5). She had bleeding consistent with termination following the procedure. She was seen in the ED at [facility] on 9/6 which showed a GS in the LUS per their [place] report. She then stopped bleeding following completion of the termination and then presented today with new onset bleeding. She had previously seen a physician at [facility] but has not been there in several years.   Today's UPT is negative. HgB is 12.9.   I reviewed her pelvic ultrasound images independently and my findings are: The uterus does show a heterogenous lining - no flow applied, but lining is not overly thickened.   I reviewed CareEverywhere: Reviewed [place] report and documentation from her visit on 9/6. HCG was positive still at that time. HgB was 13.9.   BP (!) 96/59   Pulse 71   Temp 98.5 F (36.9 C)   Resp 18   Ht 5\' 9"  (1.753 m)   Wt 74.8 kg   SpO2 96%   BMI 24.37 kg/m   Exam- performed by consulting provider   Recommendations:  - Based on my review of the [place] and the timing of her bleeding as well as the negative UPT, I agree that this is most likely her period. I do not think she needs any additional follow up besides establishing for routine gyne care.  - She can follow up with our group or with whoever is her preference for annual exam.   Thank you for this consult and if additional recommendations are needed please call [phone] for the OB/GYN attending on  service at [facility].   I spent approximately 5 minutes directly consulting with the provider and verbally discussing this case. Additionally 10 minutes minutes was spent performing chart review and documentation.   Radene Gunning, MD Attending [place], [facility] for [facility], [place]

## 2022-02-03 NOTE — ED Triage Notes (Signed)
Pt states heavy vaginal bleeding with blood clot approx golf ball size  Reports sharp lower abdominal pain  Unknown LMP ( possibly August)

## 2022-02-03 NOTE — ED Provider Notes (Signed)
Clinical Course as of 02/03/22 2103  Wed Feb 03, 2022  2027 Spoke with Dr. Damita Dunnings of OBGYN who has reviewed the patient's US imaging. Dr. Damita Dunnings feels that it is appropriate to treat the patient clinically and agrees that this is most likely to represent the patient's first menses post elective abortion. Dr. Damita Dunnings does not feel that the patient needs any specific outpatient follow up, but agrees with providing a referral to their [facility] if she desires to be seen for this again post-ED visit. [KH]    Clinical Course User Index [KH] Madison French, [place]    Vitals:   02/03/22 1658 02/03/22 1702 02/03/22 1815 02/03/22 1845  BP:  101/70 97/62 (!) 96/59  Pulse:  97 79 71  Resp:  18 18 18   Temp:  98.5 F (36.9 C) 98.5 F (36.9 C)   TempSrc:  Oral    SpO2:  97% 99% 96%  Weight: 74.8 kg     Height: 5\' 9"  (1.753 m)       US PELVIC COMPLETE WITH TRANSVAGINAL  Result Date: 02/03/2022 CLINICAL DATA:  315400 Vagina bleeding [id number] EXAM: TRANSABDOMINAL AND TRANSVAGINAL ULTRASOUND OF PELVIS TECHNIQUE: Both transabdominal and transvaginal ultrasound examinations of the pelvis were performed. Transabdominal technique was performed for global imaging of the pelvis including uterus, ovaries, adnexal regions, and pelvic cul-de-sac. It was necessary to proceed with endovaginal exam following the transabdominal exam to visualize the endometrium and adnexal structures. COMPARISON:  12/16/2021 FINDINGS: Uterus Measurements: 7.6 x 4.0 x 5.3 cm = volume: 84.2 mL. No fibroids or other mass visualized. Endometrium Thickness: 15 mm. The endometrium is heterogeneous, with hypoechoic material seen from the endometrial cavity through the endocervical canal. Given history of reported recent miscarriage, retained products of conception cannot be excluded. Gynecologic consultation recommended. Right ovary Measurements: 4.4 x 2.3 x 3.5 cm = volume: 18.5 mL. Normal appearance/no adnexal mass. Left ovary Measurements: 3.3 x 1.9  x 2.0 cm = volume: 6.4 mL. Normal appearance/no adnexal mass. Other findings No abnormal free fluid. IMPRESSION: 1. Borderline thickened endometrium, with diffuse heterogeneity as above. Given history of reported recent miscarriage, retained products of conception cannot be excluded. Gynecologic consultation is recommended. 2. Otherwise unremarkable pelvic ultrasound. Electronically Signed   By: Randa Ngo M.D.   On: 02/03/2022 19:51      Madison Breach, PA-C 02/03/22 2103    Hayden Rasmussen, MD 02/04/22 1012

## 2022-02-04 LAB — HIV ANTIBODY (ROUTINE TESTING W REFLEX): HIV Screen 4th Generation wRfx: NONREACTIVE

## 2022-02-04 LAB — GC/CHLAMYDIA PROBE AMP (~~LOC~~) NOT AT ARMC
Chlamydia: NEGATIVE
Comment: NEGATIVE
Comment: NORMAL
Neisseria Gonorrhea: NEGATIVE

## 2022-02-04 LAB — RPR: RPR Ser Ql: NONREACTIVE

## 2023-01-30 IMAGING — CT CT ABD-PELV W/ CM
2 of 4 series · 15 of 46 positions shown, 17 images · IV contrast (Omnipaque)
Comparison: 03/24/2021

CLINICAL DATA: Nausea, vomiting, abdominal pain

EXAM:
CT ABDOMEN AND PELVIS WITH CONTRAST
TECHNIQUE: Multidetector CT imaging of the abdomen and pelvis was performed
using the standard protocol following bolus administration of
intravenous contrast.

[Series 2: axial st · axial · 0.97mm/px · z∈[-478,-63]mm · 12 of 95 slices shown, 14 images]
[im 8/95  soft-tissue]
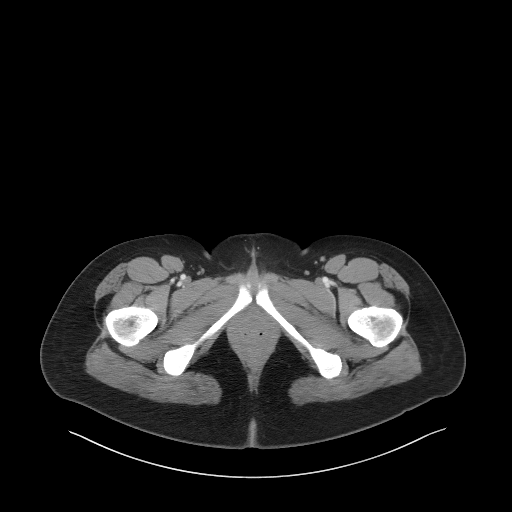
[im 8/95  bone]
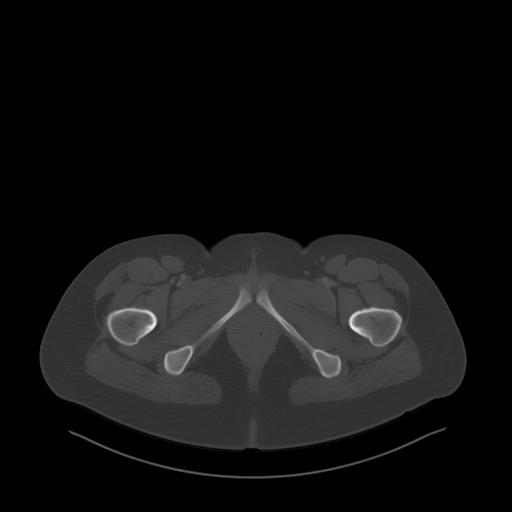
[im 16/95  soft-tissue]
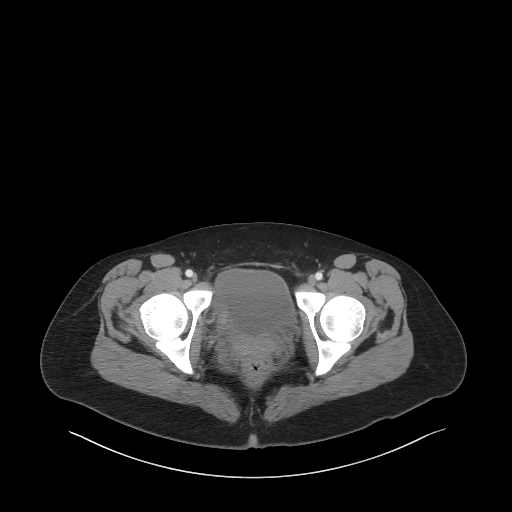
[im 23/95  soft-tissue]
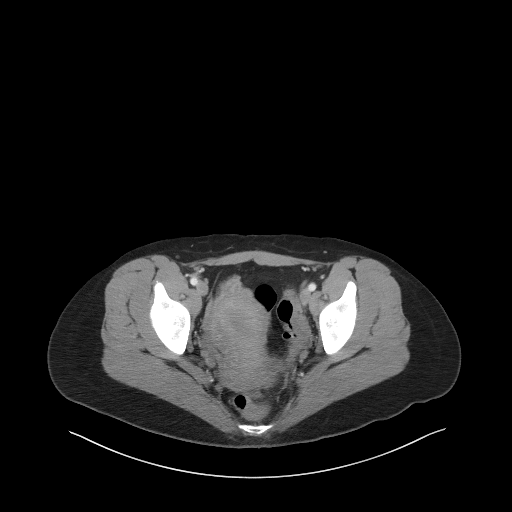
[im 31/95  soft-tissue]
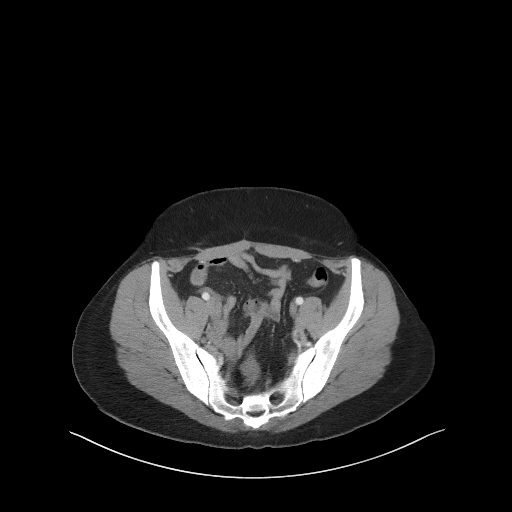
[im 38/95  soft-tissue]
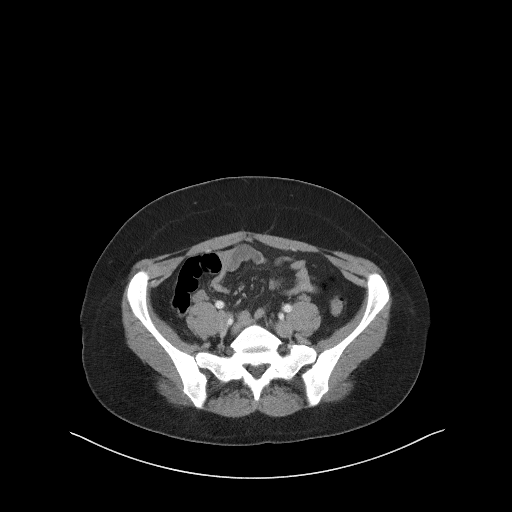
[im 46/95  soft-tissue]
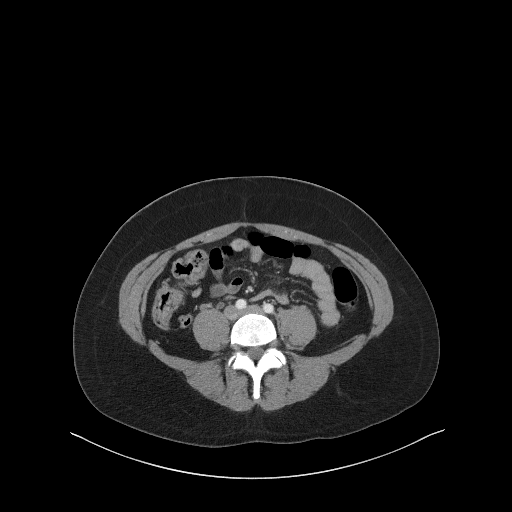
[im 53/95  soft-tissue]
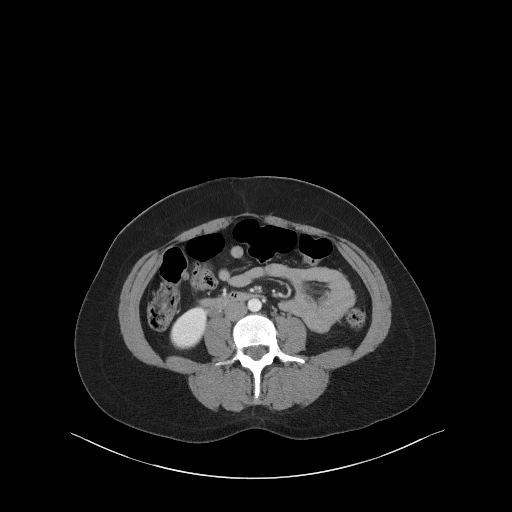
[im 61/95  soft-tissue]
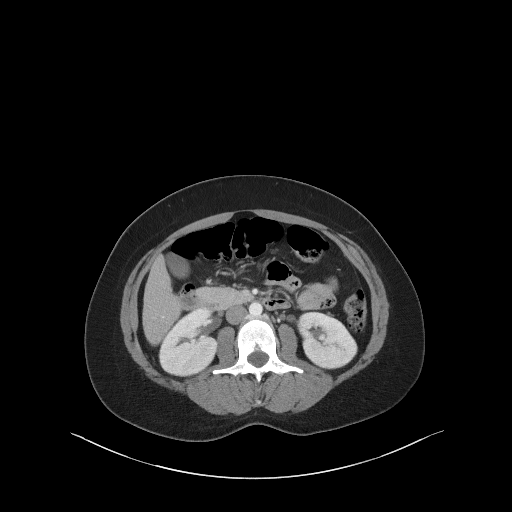
[im 68/95  soft-tissue]
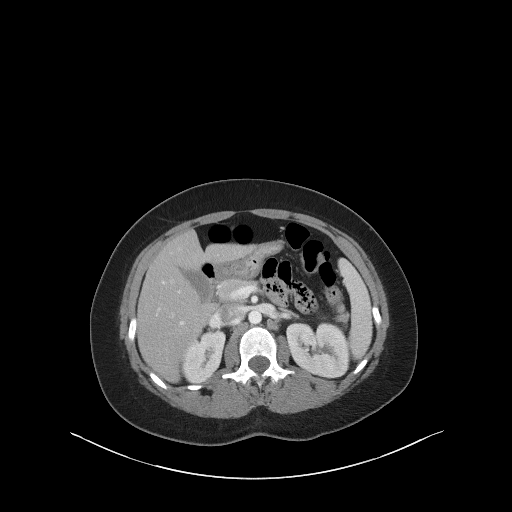
[im 68/95  bone]
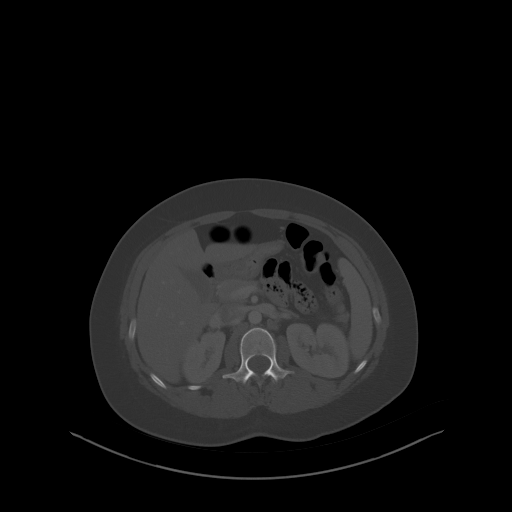
[im 76/95  soft-tissue]
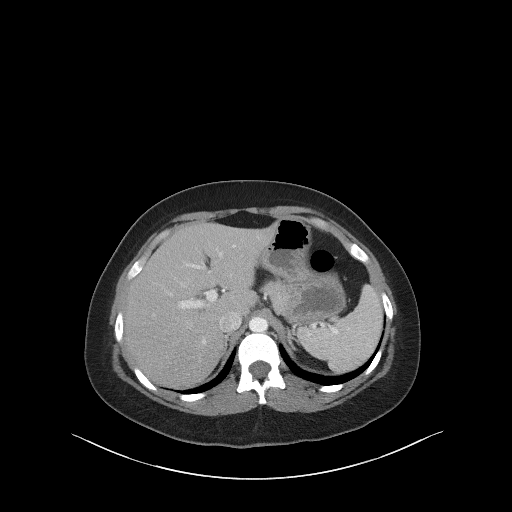
[im 83/95  soft-tissue]
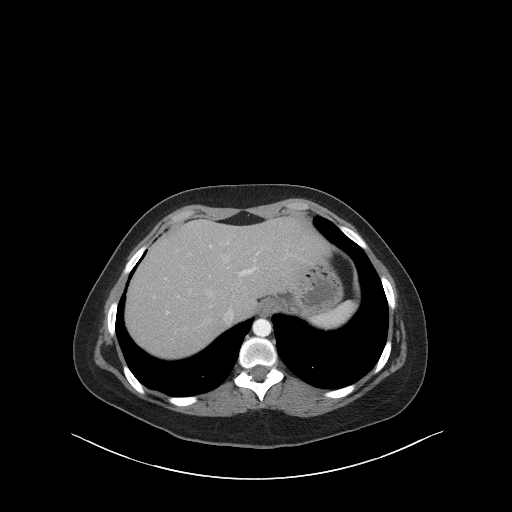
[im 91/95  soft-tissue]
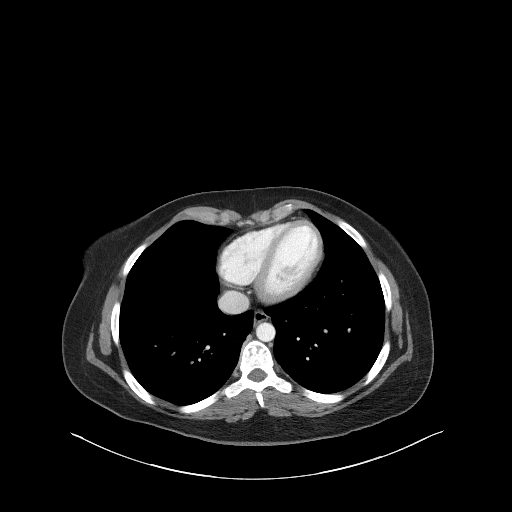

[Series 5: coronal st · coronal · 0.68mm/px · 3 of 91 slices shown]
[im 31/91  soft-tissue]
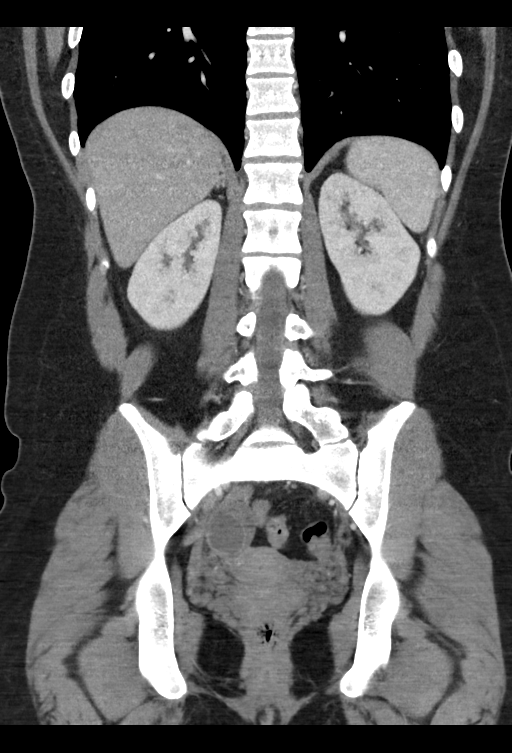
[im 41/91  soft-tissue]
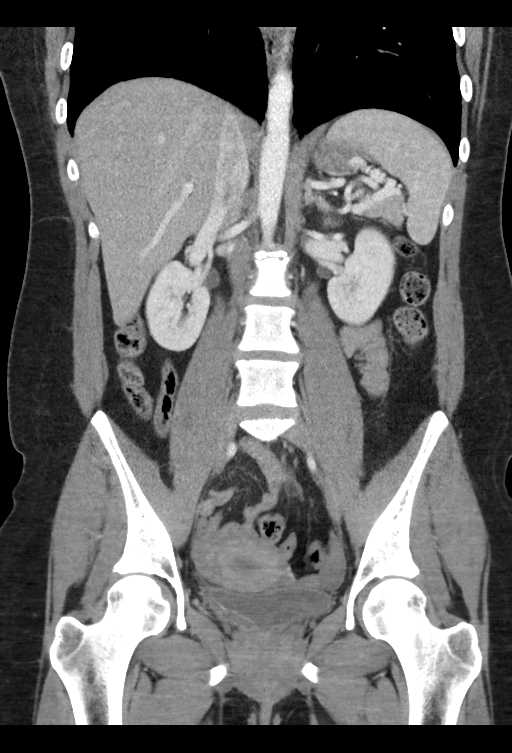
[im 51/91  soft-tissue]
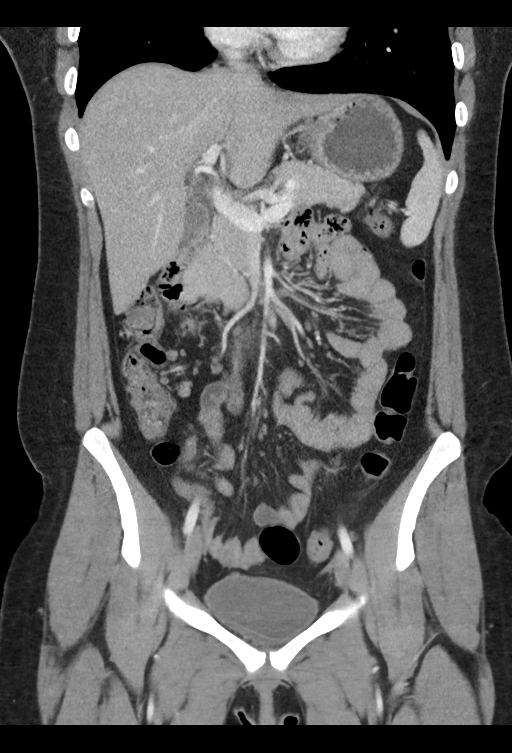

[15 of 46 positions shown; findings below may reference images not displayed]

RADIATION DOSE REDUCTION: This exam was performed according to the
departmental dose-optimization program which includes automated
exposure control, adjustment of the mA and/or kV according to
patient size and/or use of iterative reconstruction technique.

CONTRAST:  100mL OMNIPAQUE IOHEXOL 300 MG/ML  SOLN
FINDINGS: Lower chest: Unremarkable.

Hepatobiliary: There is fatty infiltration in the liver. No focal
abnormality is seen. There is no dilation of bile ducts. Gallbladder
is unremarkable.

Pancreas: No focal abnormality is seen.

Spleen: Unremarkable.

Adrenals/Urinary Tract: Adrenals are not enlarged. There is no
hydronephrosis. There are no renal or ureteral stones. Urinary
bladder is not distended.

Stomach/Bowel: Stomach is unremarkable. Small bowel loops are not
dilated. Appendix is unremarkable. Cecum is higher than usual in the
position. There is no significant wall thickening in the colon.
There is no pericolic stranding.

Vascular/Lymphatic: Unremarkable.

Reproductive: Uterus is slightly to the right of midline. There is 3
cm smooth marginated fluid density structure in the right adnexa,
possibly functional cyst. There is trace amount of free fluid in the
pelvis.

Other: There is no pneumoperitoneum. Small paraumbilical hernia
containing fat is seen.

Musculoskeletal: No acute findings are seen.
IMPRESSION: There is no evidence of intestinal obstruction or pneumoperitoneum.
Appendix is not dilated. There is no hydronephrosis.

Fatty liver.

Other findings as described in the body of the report.

## 2024-03-04 ENCOUNTER — Emergency Department (HOSPITAL_BASED_OUTPATIENT_CLINIC_OR_DEPARTMENT_OTHER)
Admission: EM | Admit: 2024-03-04 | Discharge: 2024-03-04 | Disposition: A | Discharge status: Home / Self Care | Attending: Emergency Medicine | Admitting: Emergency Medicine

## 2024-03-04 ENCOUNTER — Encounter (HOSPITAL_BASED_OUTPATIENT_CLINIC_OR_DEPARTMENT_OTHER): Payer: Self-pay

## 2024-03-04 ENCOUNTER — Other Ambulatory Visit: Payer: Self-pay

## 2024-03-04 DIAGNOSIS — Z91010 Allergy to peanuts: Secondary | ICD-10-CM | POA: Insufficient documentation

## 2024-03-04 DIAGNOSIS — J45909 Unspecified asthma, uncomplicated: Secondary | ICD-10-CM | POA: Insufficient documentation

## 2024-03-04 DIAGNOSIS — Z79899 Other long term (current) drug therapy: Secondary | ICD-10-CM | POA: Insufficient documentation

## 2024-03-04 DIAGNOSIS — Z20828 Contact with and (suspected) exposure to other viral communicable diseases: Secondary | ICD-10-CM | POA: Insufficient documentation

## 2024-03-04 DIAGNOSIS — J069 Acute upper respiratory infection, unspecified: Secondary | ICD-10-CM | POA: Diagnosis not present

## 2024-03-04 DIAGNOSIS — R059 Cough, unspecified: Secondary | ICD-10-CM | POA: Diagnosis present

## 2024-03-04 LAB — RESP PANEL BY RT-PCR (RSV, FLU A&B, COVID)  RVPGX2
Influenza A by PCR: NEGATIVE
Influenza B by PCR: NEGATIVE
Resp Syncytial Virus by PCR: NEGATIVE
SARS Coronavirus 2 by RT PCR: NEGATIVE

## 2024-03-04 MED ORDER — ALBUTEROL SULFATE HFA 108 (90 BASE) MCG/ACT IN AERS: INHALATION_SPRAY | RESPIRATORY_TRACT | 1 refills | Status: AC

## 2024-03-04 MED ORDER — BENZONATATE 200 MG PO CAPS: 200.0000 mg | ORAL_CAPSULE | Freq: Three times a day (TID) | ORAL | 0 refills | Status: AC

## 2024-03-04 NOTE — ED Provider Notes (Signed)
 [facility] Provider Note   CSN: [id number] Arrival date & time: 03/04/24  1547     Patient presents with: Cough   Madison French is a 28 y.o. female.   28 year old female with past medical history of asthma presents with concern for cough and congestion.  Symptoms started about a week ago, thought she was having allergies, now feeling lightheaded at times.  Notes that one of her children was recently diagnosed with pneumonia at urgent care.  Patient denies fevers.  Not using an inhaler.  No other complaints or concerns.       Prior to Admission medications   Medication Sig Start Date End Date Taking? Authorizing Provider  benzonatate  (TESSALON ) 200 MG capsule Take 1 capsule (200 mg total) by mouth every 8 (eight) hours for 10 days. 03/04/24 03/14/24 Yes Beverley Leita LABOR, PA-C  acetaminophen  (TYLENOL ) 325 MG tablet Take 2 tablets (650 mg total) by mouth every 6 (six) hours as needed. 03/05/21   Elnor Jayson LABOR, DO  albuterol  (PROVENTIL  HFA) 108 [phone] Base) MCG/ACT inhaler 2 puffs every 4 hours if needed for wheezing or coughing spells 03/04/24   Beverley Leita A, PA-C  albuterol  (PROVENTIL ) (2.5 MG/3ML) 0.083% nebulizer solution Take 3 mLs (2.5 mg total) by nebulization every 4 (four) hours as needed for wheezing or shortness of breath. 11/17/17   Bobbitt, Elgin Pepper, MD  budesonide  (PULMICORT  [facility]) 180 MCG/ACT inhaler 2 puffs twice daily to prevent coughing or wheezing 11/28/19   Bobbitt, Elgin Pepper, MD  budesonide  (PULMICORT ) 0.5 MG/2ML nebulizer solution Take 2 mLs (0.5 mg total) by nebulization 2 (two) times daily. 11/28/19   Bobbitt, Elgin Pepper, MD  budesonide  (RHINOCORT  AQUA) 32 MCG/ACT nasal spray 1 spray twice daily as needed for stuffy nose. 11/28/19   Bobbitt, Elgin Pepper, MD  cephALEXin  (KEFLEX ) 500 MG capsule Take 1 capsule (500 mg total) by mouth 4 (four) times daily. 08/17/21   Zackowski, Scott, MD  Crisaborole  (EUCRISA ) 2 %  OINT Apply 1 application topically 2 (two) times daily as needed (to red itchy areas). Patient not taking: Reported on 11/28/2019 11/23/18   Bobbitt, Elgin Pepper, MD  Crisaborole  (EUCRISA ) 2 % OINT Apply  Twice daily to affected areas as needed. 11/28/19   Bobbitt, Elgin Pepper, MD  EPINEPHrine  (EPIPEN  2-PAK) 0.3 mg/0.3 mL IJ SOAJ injection Use as directed for severe allergic reaction. 11/28/19   Bobbitt, Elgin Pepper, MD  famotidine  (PEPCID ) 20 MG tablet Take 1 tablet (20 mg total) by mouth 2 (two) times daily. Patient not taking: Reported on 11/28/2019 09/16/19   Rogers, Veronica C, CNM  fluticasone  (FLONASE ) 50 MCG/ACT nasal spray 2 sprays per nostril once a day for stuffy nose 11/17/17   Bobbitt, Elgin Pepper, MD  guaiFENesin  (ROBITUSSIN) 100 MG/5ML liquid Take 5 mLs by mouth every 4 (four) hours as needed for cough or to loosen phlegm. 03/05/21   Elnor Jayson A, DO  ipratropium (ATROVENT ) 0.06 % nasal spray Two sprays each nostril three times a day as needed for runny nose. Patient not taking: Reported on 11/23/2018 11/17/17   Bobbitt, Elgin Pepper, MD  loratadine  (CLARITIN ) 10 MG tablet Take 1 tablet (10 mg total) by mouth daily as needed for allergies, rhinitis or itching. 11/17/17   Bobbitt, Elgin Pepper, MD  Olopatadine  HCl (PAZEO) 0.7 % SOLN Place 1 drop into both eyes 1 day or 1 dose. 11/17/17   Bobbitt, Elgin Pepper, MD  ondansetron  (ZOFRAN  ODT) 4 MG disintegrating tablet Take 1  tablet (4 mg total) by mouth every 8 (eight) hours as needed for nausea or vomiting. 09/16/19   Rogers, Veronica C, CNM  ondansetron  (ZOFRAN  ODT) 4 MG disintegrating tablet Take 1 tablet (4 mg total) by mouth every 8 (eight) hours as needed for nausea or vomiting. 09/11/20   Tegeler, Lonni PARAS, MD  ondansetron  (ZOFRAN -ODT) 4 MG disintegrating tablet Take 1 tablet (4 mg total) by mouth every 8 (eight) hours as needed. 08/17/21   Zackowski, Scott, MD  potassium chloride  SA (KLOR-CON  M) 20 MEQ tablet Take 1 tablet (20 mEq total) by  mouth 2 (two) times daily. 08/17/21   Zackowski, Scott, MD  potassium chloride  SA (KLOR-CON ) 20 MEQ tablet Take 1 tablet (20 mEq total) by mouth 2 (two) times daily for 5 days. 09/17/19 09/22/19  Roselyn Carlin NOVAK, MD  prochlorperazine  (COMPAZINE ) 10 MG tablet Take 1 tablet (10 mg total) by mouth 2 (two) times daily as needed for nausea or vomiting. 10/16/21   Patsey Lot, MD  promethazine  (PHENERGAN ) 25 MG suppository Place 1 suppository (25 mg total) rectally every 6 (six) hours as needed for nausea or vomiting. 10/07/20   Shepard, Margaux, PA-C  promethazine  (PHENERGAN ) 25 MG tablet Take 1 tablet (25 mg total) by mouth every 6 (six) hours as needed for nausea or vomiting. 09/19/19   Neldon Hamp RAMAN, PA    Allergies: Cantaloupe extract allergy skin test, Citrullus vulgaris, Peanut -containing drug products, Sesame seed (diagnostic), Watermelon flavoring agent (non-screening), and Other    Review of Systems Negative except as per HPI Updated Vital Signs BP 124/67 (BP Location: Right Arm)   Pulse 88   Temp 98.6 F (37 C) (Oral)   Resp 16   Ht 5' 9 (1.753 m)   Wt 77.1 kg   SpO2 100%   BMI 25.10 kg/m   Physical Exam Vitals and nursing note reviewed.  Constitutional:      General: She is not in acute distress.    Appearance: She is well-developed. She is not diaphoretic.  HENT:     Head: Normocephalic and atraumatic.     Right Ear: Tympanic membrane and ear canal normal.     Left Ear: Tympanic membrane and ear canal normal.     Nose: Congestion present.     Mouth/Throat:     Mouth: Mucous membranes are moist.  Eyes:     Conjunctiva/sclera: Conjunctivae normal.  Cardiovascular:     Rate and Rhythm: Regular rhythm.     Heart sounds: Normal heart sounds.  Pulmonary:     Effort: Pulmonary effort is normal.     Breath sounds: Normal breath sounds.  Musculoskeletal:     Cervical back: Neck supple.  Lymphadenopathy:     Cervical: No cervical adenopathy.  Skin:    General: Skin is  warm and dry.     Findings: No erythema or rash.  Neurological:     Mental Status: She is alert and oriented to person, place, and time.  Psychiatric:        Behavior: Behavior normal.     (all labs ordered are listed, but only abnormal results are displayed) Labs Reviewed  RESP PANEL BY RT-PCR (RSV, FLU A&B, COVID)  [id number]    EKG: None  Radiology: No results found.   Procedures   Medications Ordered in the ED - No data to display  Medical Decision Making Risk Prescription drug management.   28 year old female presents with concern for cough and congestion.  Her 1 child has recently diagnosed with pneumonia based on clinical exam.  Her 40-year-old is here in the ER with her today with fever and vomiting has tested positive for influenza A.  Patient is negative for COVID, flu, RSV.  Likely viral illness, high risk for developing the flu.  Recommend masking and sanitizer at home.  Provided with prescription for albuterol  refill and Tessalon .     Final diagnoses:  Viral URI with cough  Exposure to the flu    ED Discharge Orders          Ordered    albuterol  (PROVENTIL  HFA) 108 (90 Base) MCG/ACT inhaler        03/04/24 1705    benzonatate  (TESSALON ) 200 MG capsule  Every 8 hours        03/04/24 1705               Beverley Leita LABOR, PA-C 03/04/24 1711    Lenor Hollering, MD 03/04/24 2258

## 2024-03-04 NOTE — Discharge Instructions (Signed)
 Home to rest. Use inhaler as needed as prescribed. Tessalon  as needed as prescribed for cough.

## 2024-03-04 NOTE — ED Triage Notes (Signed)
 Cough with yellow mucus since last week. Pt states sx were getting better and then worsened again. One of pt daughter is being treated for pneumonia.
